# Patient Record
Sex: Female | Born: 1990 | Race: Black or African American | Hispanic: No | Marital: Married | Smoking: Never smoker
Health system: Southern US, Community
[De-identification: ages and names within clinical notes are randomized; demographics above are authoritative.]

## PROBLEM LIST (undated history)

## (undated) DIAGNOSIS — E559 Vitamin D deficiency, unspecified: Secondary | ICD-10-CM

## (undated) DIAGNOSIS — E785 Hyperlipidemia, unspecified: Secondary | ICD-10-CM

## (undated) DIAGNOSIS — E049 Nontoxic goiter, unspecified: Secondary | ICD-10-CM

## (undated) DIAGNOSIS — Q2730 Arteriovenous malformation, site unspecified: Secondary | ICD-10-CM

## (undated) DIAGNOSIS — E039 Hypothyroidism, unspecified: Secondary | ICD-10-CM

## (undated) DIAGNOSIS — R5383 Other fatigue: Secondary | ICD-10-CM

## (undated) DIAGNOSIS — N809 Endometriosis, unspecified: Secondary | ICD-10-CM

## (undated) HISTORY — DX: Endometriosis, unspecified: N80.9

## (undated) HISTORY — DX: Hypothyroidism, unspecified: E03.9

## (undated) HISTORY — DX: Other fatigue: R53.83

## (undated) HISTORY — DX: Hyperlipidemia, unspecified: E78.5

## (undated) HISTORY — PX: ENDOMETRIAL ABLATION: SHX621

## (undated) HISTORY — DX: Vitamin D deficiency, unspecified: E55.9

## (undated) HISTORY — DX: Nontoxic goiter, unspecified: E04.9

## (undated) HISTORY — PX: WISDOM TOOTH EXTRACTION: SHX21

## (undated) HISTORY — PX: TONSILLECTOMY: SUR1361

---
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
## 2012-05-19 ENCOUNTER — Ambulatory Visit: Payer: Self-pay | Admitting: Internal Medicine

## 2012-05-24 ENCOUNTER — Emergency Department (HOSPITAL_COMMUNITY)
Admission: EM | Admit: 2012-05-24 | Discharge: 2012-05-25 | Disposition: A | Discharge status: Home / Self Care | Payer: Managed Care, Other (non HMO) | Attending: Emergency Medicine | Admitting: Emergency Medicine

## 2012-05-24 ENCOUNTER — Encounter (HOSPITAL_COMMUNITY): Payer: Self-pay | Admitting: *Deleted

## 2012-05-24 DIAGNOSIS — Z880 Allergy status to penicillin: Secondary | ICD-10-CM | POA: Insufficient documentation

## 2012-05-24 DIAGNOSIS — Z882 Allergy status to sulfonamides status: Secondary | ICD-10-CM | POA: Insufficient documentation

## 2012-05-24 DIAGNOSIS — M79669 Pain in unspecified lower leg: Secondary | ICD-10-CM

## 2012-05-24 DIAGNOSIS — M79609 Pain in unspecified limb: Secondary | ICD-10-CM | POA: Insufficient documentation

## 2012-05-24 NOTE — ED Notes (Signed)
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

## 2012-05-24 NOTE — ED Notes (Signed)
Pt st's she first started having pain in right lower leg and had a neg doppler study.  Now having pain in bil lower legs with no swelling.  Pulses present and strong bil.

## 2012-05-24 NOTE — ED Notes (Signed)
Pt to ED c/o bil leg pain.  Pt was seen by pcp 4 days ago for R lower leg pain and swelling.  [place] was negative for dvt per pt and pt was told she may have a pinched nerve.  Today her L leg began hurting as well, pain the same as R leg, starts low and shoots up, causing numbness.  Pt was told by pcp to stop BC and she did.

## 2012-05-25 ENCOUNTER — Ambulatory Visit (HOSPITAL_COMMUNITY)
Admission: RE | Admit: 2012-05-25 | Discharge: 2012-05-25 | Disposition: A | Discharge status: Home / Self Care | Payer: Managed Care, Other (non HMO) | Source: Ambulatory Visit | Attending: Pediatrics | Admitting: Pediatrics

## 2012-05-25 DIAGNOSIS — M7989 Other specified soft tissue disorders: Secondary | ICD-10-CM | POA: Insufficient documentation

## 2012-05-25 DIAGNOSIS — M79609 Pain in unspecified limb: Secondary | ICD-10-CM

## 2012-05-25 LAB — CBC
HCT: 35.5 % — ABNORMAL LOW (ref 36.0–46.0)
Hemoglobin: 12.2 g/dL (ref 12.0–15.0)
MCH: 29.8 pg (ref 26.0–34.0)
MCHC: 34.4 g/dL (ref 30.0–36.0)
RBC: 4.09 MIL/uL (ref 3.87–5.11)

## 2012-05-25 LAB — BASIC METABOLIC PANEL
BUN: 12 mg/dL (ref 6–23)
CO2: 28 mEq/L (ref 19–32)
GFR calc non Af Amer: >90 mL/min (ref >90)
Glucose, Bld: 89 mg/dL (ref 70–99)
Potassium: 3.6 mEq/L (ref 3.5–5.1)

## 2012-05-25 MED ORDER — ENOXAPARIN SODIUM 80 MG/0.8ML ~~LOC~~ SOLN
65.0000 mg | Freq: Once | SUBCUTANEOUS | Status: AC
  Administered 2012-05-25: 65 mg via SUBCUTANEOUS
  Filled 2012-05-25: qty 0.8

## 2012-05-25 NOTE — ED Provider Notes (Signed)
History     CSN: [id number]  Arrival date & time 05/24/12  2319   First MD Initiated Contact with Patient 05/24/12 2337      Chief Complaint  Patient presents with  . Leg Pain    bil    (Consider location/radiation/quality/duration/timing/severity/associated sxs/prior treatment) HPI Comments: No female presents the emergency department with her mom complaining of worsening bilateral leg pain. She states her right calf began hurting a little less than 2 weeks ago. She states the pain is constant and worse with walking. She tried taking ibuprofen and Tylenol Extra Strength without relief. She went to see her primary care Dr. 4 days ago who told her to stop her birth control and obtained a lower tremor the ultrasound. The ultrasound was negative for DVT. Since then she went on a car trip to [place], and since then her left calf began hurting along with worsening right calf pain. Pain radiates around her leg up to her anterior thighs. Admits to occasional numbness in her calf. Denies back pain, fever, chills, nausea or vomiting. No significant medical history.  Patient is a 21 y.o. female presenting with leg pain. The history is provided by the patient and a parent.  Leg Pain  Associated symptoms include numbness.    History reviewed. No pertinent past medical history.  History reviewed. No pertinent past surgical history.  No family history on file.  History  Substance Use Topics  . Smoking status: Not on file  . Smokeless tobacco: Not on file  . Alcohol Use: Not on file    OB History    Grav Para Term Preterm Abortions TAB SAB Ect Mult Living                  Review of Systems  Constitutional: Negative for fever and chills.  Cardiovascular: Negative for leg swelling.  Gastrointestinal: Negative for nausea and vomiting.  Musculoskeletal: Positive for myalgias and gait problem. Negative for back pain.  Skin: Negative for color change.  Neurological: Positive for numbness.     Allergies  Penicillins cross reactors; Amoxicillin; and Sulfa drugs cross reactors  Home Medications   Current Outpatient Rx  Name Route Sig Dispense Refill  . ACETAMINOPHEN 500 MG PO TABS Oral Take 500 mg by mouth every 6 (six) hours as needed. For pain    . IBUPROFEN 200 MG PO TABS Oral Take 400 mg by mouth every 6 (six) hours as needed. For pain    . ADULT MULTIVITAMIN W/MINERALS CH Oral Take 1 tablet by mouth daily.    . NORETHINDRONE ACET-ETHINYL EST 1-20 MG-MCG PO TABS Oral Take 1 tablet by mouth daily.      BP 126/80  Pulse 74  Temp 97.7 F (36.5 C) (Oral)  Resp 18  SpO2 100%  Physical Exam  Constitutional: She is oriented to person, place, and time. She appears well-developed and well-nourished. No distress.  HENT:  Head: Normocephalic and atraumatic.  Eyes: Conjunctivae normal and EOM are normal.  Neck: Normal range of motion. Neck supple.  Cardiovascular: Normal rate, regular rhythm, normal heart sounds and intact distal pulses.        No extremity edema.  Pulmonary/Chest: Effort normal and breath sounds normal.  Musculoskeletal:       Right knee: Normal.       Left knee: Normal.       Lumbar back: Normal.       Calf tenderness present bilaterally. Tenderness to the anterior aspect of her quads. Positive Homans  bilateral. Full range of motion of lower extremities. Calves are equal in size and do not appear edematous.  Neurological: She is alert and oriented to person, place, and time. She has normal strength. No sensory deficit.  Skin: Skin is warm and dry. No erythema.  Psychiatric: She has a normal mood and affect. Her behavior is normal.    ED Course  Procedures (including critical care time)  Labs Reviewed - No data to display No results found.   1. Calf pain       MDM  21 year old female with bilateral calf pain. Lower extremity ultrasound with PCP negative for DVT last week. Increased pain after traveling to [place] this past weekend. No calf  swelling. Calves are equal in size. Positive Homans bilateral. Positive calf tenderness, right greater than left. Denies any history of hematologic disorder, or bleeding or clotting disorders. Mom denies any family history of the same. She has no shortness of breath, tachypnea, chest pain, tachycardia or palpitations concerning for pulmonary embolism. Lovenox given as prophylaxis. Patient will return first thing in the morning for bilateral lower chimney duplex ultrasound due to ultrasound being closed for the night. Case discussed with Dr. Norlene Campbell who agrees with plan of care.       Trevor Mace, PA-C 05/25/12 0110

## 2012-05-25 NOTE — ED Notes (Signed)
Family at bedside. Plan of care explained to pt.  Pt voices understanding.  Waiting for Lovenox to come from pharmacy.

## 2012-05-25 NOTE — ED Notes (Signed)
Pt dc to home.  Will f/u as needed. Ambulatory to exit without difficulty.

## 2012-05-25 NOTE — ED Provider Notes (Signed)
Medical screening examination/treatment/procedure(s) were performed by non-physician practitioner and as supervising physician I was immediately available for consultation/collaboration.  Amelia Burgard M Cincere Deprey, MD 05/25/12 0516 

## 2012-05-25 NOTE — Progress Notes (Signed)
Bilateral:  No evidence of DVT, superficial thrombosis, or Baker's Cyst.   

## 2014-02-23 ENCOUNTER — Emergency Department (HOSPITAL_COMMUNITY): Payer: Managed Care, Other (non HMO)

## 2014-02-23 ENCOUNTER — Encounter (HOSPITAL_COMMUNITY): Payer: Self-pay | Admitting: Emergency Medicine

## 2014-02-23 ENCOUNTER — Emergency Department (HOSPITAL_COMMUNITY)
Admission: EM | Admit: 2014-02-23 | Discharge: 2014-02-23 | Disposition: A | Discharge status: Home / Self Care | Payer: Managed Care, Other (non HMO) | Attending: Emergency Medicine | Admitting: Emergency Medicine

## 2014-02-23 DIAGNOSIS — R110 Nausea: Secondary | ICD-10-CM | POA: Insufficient documentation

## 2014-02-23 DIAGNOSIS — R1084 Generalized abdominal pain: Secondary | ICD-10-CM | POA: Insufficient documentation

## 2014-02-23 DIAGNOSIS — Z79899 Other long term (current) drug therapy: Secondary | ICD-10-CM | POA: Insufficient documentation

## 2014-02-23 DIAGNOSIS — Z3202 Encounter for pregnancy test, result negative: Secondary | ICD-10-CM | POA: Insufficient documentation

## 2014-02-23 DIAGNOSIS — K921 Melena: Secondary | ICD-10-CM | POA: Insufficient documentation

## 2014-02-23 DIAGNOSIS — R109 Unspecified abdominal pain: Secondary | ICD-10-CM

## 2014-02-23 DIAGNOSIS — Z880 Allergy status to penicillin: Secondary | ICD-10-CM | POA: Insufficient documentation

## 2014-02-23 DIAGNOSIS — N939 Abnormal uterine and vaginal bleeding, unspecified: Secondary | ICD-10-CM

## 2014-02-23 DIAGNOSIS — N898 Other specified noninflammatory disorders of vagina: Secondary | ICD-10-CM | POA: Insufficient documentation

## 2014-02-23 LAB — COMPREHENSIVE METABOLIC PANEL
ALBUMIN: 4.3 g/dL (ref 3.5–5.2)
ALK PHOS: 42 U/L (ref 39–117)
ALT: 9 U/L (ref 0–35)
AST: 16 U/L (ref 0–37)
Anion gap: 12 (ref 5–15)
BUN: 10 mg/dL (ref 6–23)
CO2: 26 mEq/L (ref 19–32)
Calcium: 9.4 mg/dL (ref 8.4–10.5)
Chloride: 101 mEq/L (ref 96–112)
Creatinine, Ser: 0.78 mg/dL (ref 0.50–1.10)
GFR calc Af Amer: >90 mL/min (ref >90)
GFR calc non Af Amer: >90 mL/min (ref >90)
Glucose, Bld: 87 mg/dL (ref 70–99)
POTASSIUM: 4.2 meq/L (ref 3.7–5.3)
SODIUM: 139 meq/L (ref 137–147)
TOTAL PROTEIN: 7.9 g/dL (ref 6.0–8.3)
Total Bilirubin: 1.5 mg/dL — ABNORMAL HIGH (ref 0.3–1.2)

## 2014-02-23 LAB — WET PREP, GENITAL
Clue Cells Wet Prep HPF POC: NONE SEEN
Trich, Wet Prep: NONE SEEN
Yeast Wet Prep HPF POC: NONE SEEN

## 2014-02-23 LAB — CBC WITH DIFFERENTIAL/PLATELET
BASOS PCT: 1 % (ref 0–1)
Basophils Absolute: 0 10*3/uL (ref 0.0–0.1)
EOS ABS: 0.2 10*3/uL (ref 0.0–0.7)
Eosinophils Relative: 5 % (ref 0–5)
HCT: 37.5 % (ref 36.0–46.0)
Hemoglobin: 12.6 g/dL (ref 12.0–15.0)
Lymphocytes Relative: 48 % — ABNORMAL HIGH (ref 12–46)
Lymphs Abs: 1.9 10*3/uL (ref 0.7–4.0)
MCH: 29.5 pg (ref 26.0–34.0)
MCHC: 33.6 g/dL (ref 30.0–36.0)
MCV: 87.8 fL (ref 78.0–100.0)
Monocytes Absolute: 0.4 10*3/uL (ref 0.1–1.0)
Monocytes Relative: 10 % (ref 3–12)
NEUTROS PCT: 36 % — AB (ref 43–77)
Neutro Abs: 1.4 10*3/uL — ABNORMAL LOW (ref 1.7–7.7)
PLATELETS: 284 10*3/uL (ref 150–400)
RBC: 4.27 MIL/uL (ref 3.87–5.11)
RDW: 12.8 % (ref 11.5–15.5)
WBC: 3.9 10*3/uL — ABNORMAL LOW (ref 4.0–10.5)

## 2014-02-23 LAB — URINALYSIS, ROUTINE W REFLEX MICROSCOPIC
Bilirubin Urine: NEGATIVE
GLUCOSE, UA: NEGATIVE mg/dL
Ketones, ur: NEGATIVE mg/dL
Leukocytes, UA: NEGATIVE
Nitrite: NEGATIVE
PH: 7.5 (ref 5.0–8.0)
Protein, ur: NEGATIVE mg/dL
SPECIFIC GRAVITY, URINE: 1.023 (ref 1.005–1.030)
UROBILINOGEN UA: 1 mg/dL (ref 0.0–1.0)

## 2014-02-23 LAB — URINE MICROSCOPIC-ADD ON

## 2014-02-23 LAB — POC URINE PREG, ED: Preg Test, Ur: NEGATIVE

## 2014-02-23 MED ORDER — IOHEXOL 300 MG/ML  SOLN
25.0000 mL | Freq: Once | INTRAMUSCULAR | Status: AC | PRN
  Administered 2014-02-23: 25 mL via ORAL

## 2014-02-23 MED ORDER — IOHEXOL 300 MG/ML  SOLN
100.0000 mL | Freq: Once | INTRAMUSCULAR | Status: AC | PRN
  Administered 2014-02-23: 100 mL via INTRAVENOUS

## 2014-02-23 NOTE — ED Provider Notes (Signed)
CSN: [id number]     Arrival date & time 02/23/14  1623 History   First MD Initiated Contact with Patient 02/23/14 1653     Chief Complaint  Patient presents with  . Abdominal Pain     (Consider location/radiation/quality/duration/timing/severity/associated sxs/prior Treatment) The history is provided by the patient.   patient's had pain in her abdomen for the last 2 days. Crampy. She's had some black stool. She also states that her periods have been abnormal. She states both that it was 2 weeks late and that came 4 weeks after the last one. No other bleeding. She states she has had cramping. No dysuria. The pain is dull.  No past medical history on file. No past surgical history on file. No family history on file. History  Substance Use Topics  . Smoking status: Never Smoker   . Smokeless tobacco: Not on file  . Alcohol Use: No   OB History   Grav Para Term Preterm Abortions TAB SAB Ect Mult Living                 Review of Systems  Constitutional: Negative for activity change and appetite change.  Eyes: Negative for pain.  Respiratory: Negative for chest tightness and shortness of breath.   Cardiovascular: Negative for chest pain and leg swelling.  Gastrointestinal: Positive for nausea, abdominal pain and blood in stool. Negative for vomiting and diarrhea.  Genitourinary: Positive for vaginal bleeding. Negative for flank pain, decreased urine volume, vaginal pain and pelvic pain.  Musculoskeletal: Negative for back pain and neck stiffness.  Skin: Negative for rash.  Neurological: Negative for weakness, numbness and headaches.  Psychiatric/Behavioral: Negative for behavioral problems.      Allergies  Penicillins cross reactors; Amoxicillin; and Sulfa drugs cross reactors  Home Medications   Prior to Admission medications   Medication Sig Start Date End Date Taking? Authorizing Provider  acetaminophen (TYLENOL) 500 MG tablet Take 500 mg by mouth every 6 (six) hours as  needed. For pain    Historical Provider, MD  ibuprofen (ADVIL,MOTRIN) 200 MG tablet Take 400 mg by mouth every 6 (six) hours as needed. For pain    Historical Provider, MD  Multiple Vitamin (MULTIVITAMIN WITH MINERALS) TABS Take 1 tablet by mouth daily.    Historical Provider, MD  norethindrone-ethinyl estradiol (MICROGESTIN,JUNEL,LOESTRIN) 1-20 MG-MCG tablet Take 1 tablet by mouth daily.    Historical Provider, MD   BP 118/75  Pulse 64  Temp(Src) 97.7 F (36.5 C) (Oral)  Resp 18  Ht 5\' 11"  (1.803 m)  Wt 144 lb (65.318 kg)  BMI 20.09 kg/m2  SpO2 100% Physical Exam  Nursing note and vitals reviewed. Constitutional: She is oriented to person, place, and time. She appears well-developed and well-nourished.  HENT:  Head: Normocephalic and atraumatic.  Eyes: EOM are normal. Pupils are equal, round, and reactive to light.  Neck: Normal range of motion. Neck supple.  Cardiovascular: Normal rate, regular rhythm and normal heart sounds.   No murmur heard. Pulmonary/Chest: Effort normal and breath sounds normal. No respiratory distress. She has no wheezes. She has no rales.  Abdominal: Soft. Bowel sounds are normal. She exhibits no distension. There is tenderness. There is no rebound and no guarding.  Mild diffuse abdominal tenderness. No rebound guarding. No masses.  Genitourinary:  Vaginal bleeding. No cervical motion tenderness. Rectal exam done with normal color stool, however there was some blood, which may come from vagina.  Musculoskeletal: Normal range of motion.  Neurological: She is alert and  oriented to person, place, and time. No cranial nerve deficit.  Skin: Skin is warm and dry.  Psychiatric: She has a normal mood and affect. Her speech is normal.    ED Course  Procedures (including critical care time) Labs Review Labs Reviewed  CBC WITH DIFFERENTIAL - Abnormal; Notable for the following:    WBC 3.9 (*)    Neutrophils Relative % 36 (*)    Neutro Abs 1.4 (*)     Lymphocytes Relative 48 (*)    All other components within normal limits  COMPREHENSIVE METABOLIC PANEL  URINALYSIS, ROUTINE W REFLEX MICROSCOPIC  POC URINE PREG, ED  POC OCCULT BLOOD, ED    Imaging Review No results found.   EKG Interpretation None      MDM   Final diagnoses:  None    Patient with abdominal pain. Some vaginal bleeding. Laboratory reassuring. Pelvic exam nonspecific vaginal bleeding. CT scan done in reassuring. Patient has followup with gynecology already. Will discharge home    Juliet Rude. Rubin Payor, MD 02/23/14 2142

## 2014-02-23 NOTE — Discharge Instructions (Signed)

## 2014-02-23 NOTE — ED Notes (Signed)
Pt completed CT contrast/notified CT staff

## 2014-02-23 NOTE — ED Notes (Signed)
She states "ive had kidney and stomach pain, black stool, and i felt like i was going to start my period a while back but it ended up being 2 weeks late."

## 2014-02-24 ENCOUNTER — Inpatient Hospital Stay (HOSPITAL_COMMUNITY)
Admission: AD | Admit: 2014-02-24 | Discharge: 2014-02-24 | Disposition: A | Discharge status: Home / Self Care | Payer: Managed Care, Other (non HMO) | Source: Ambulatory Visit | Attending: Obstetrics & Gynecology | Admitting: Obstetrics & Gynecology

## 2014-02-24 ENCOUNTER — Encounter (HOSPITAL_COMMUNITY): Payer: Self-pay | Admitting: *Deleted

## 2014-02-24 ENCOUNTER — Inpatient Hospital Stay (HOSPITAL_COMMUNITY): Payer: Managed Care, Other (non HMO)

## 2014-02-24 DIAGNOSIS — N946 Dysmenorrhea, unspecified: Secondary | ICD-10-CM | POA: Insufficient documentation

## 2014-02-24 DIAGNOSIS — R109 Unspecified abdominal pain: Secondary | ICD-10-CM | POA: Insufficient documentation

## 2014-02-24 DIAGNOSIS — N83209 Unspecified ovarian cyst, unspecified side: Secondary | ICD-10-CM

## 2014-02-24 LAB — GC/CHLAMYDIA PROBE AMP
CT Probe RNA: NEGATIVE
GC Probe RNA: NEGATIVE

## 2014-02-24 LAB — URINALYSIS, ROUTINE W REFLEX MICROSCOPIC
Bilirubin Urine: NEGATIVE
Glucose, UA: NEGATIVE mg/dL
KETONES UR: NEGATIVE mg/dL
NITRITE: NEGATIVE
PH: 7.5 (ref 5.0–8.0)
PROTEIN: NEGATIVE mg/dL
Specific Gravity, Urine: 1.01 (ref 1.005–1.030)
Urobilinogen, UA: 1 mg/dL (ref 0.0–1.0)

## 2014-02-24 LAB — POCT PREGNANCY, URINE: Preg Test, Ur: NEGATIVE

## 2014-02-24 LAB — URINE MICROSCOPIC-ADD ON

## 2014-02-24 LAB — HIV ANTIBODY (ROUTINE TESTING W REFLEX): HIV: NONREACTIVE

## 2014-02-24 MED ORDER — NAPROXEN SODIUM 550 MG PO TABS: 550.0000 mg | ORAL_TABLET | Freq: Two times a day (BID) | ORAL | Status: DC

## 2014-02-24 MED ORDER — KETOROLAC TROMETHAMINE 60 MG/2ML IM SOLN
60.0000 mg | Freq: Once | INTRAMUSCULAR | Status: AC
  Administered 2014-02-24: 60 mg via INTRAMUSCULAR
  Filled 2014-02-24: qty 2

## 2014-02-24 NOTE — MAU Provider Note (Signed)
History     CSN: [id number]  Arrival date and time: 02/24/14 1884   First Provider Initiated Contact with Patient 02/24/14 2014      Chief Complaint  Patient presents with  . Abdominal Pain   HPI Pt is not pregnant and presents with lower abd pain for 2 days. Menarche was about age 23 with RCM with Minimal dysmenorrhea until recently when her periods became somewhat irregular.  Pt had onset of bilateral lower abdominal pain as well as bilateral mid abd pain- pain is associated with decreased appetite/nausea but no vomiting.  Pt is not sexually active. Pt /s mother says there is a family history of endometriosis, fibroids and ovarian cysts.  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  Pt says she has taken a Naproxyn today at about   Claudie Revering, RN Registered Nurse Signed  MAU Note Service date: 02/24/2014 7:45 PM   Lower abdominal cramping x 1 week. Positive for vaginal bleeding; states currently on period but was told last night by ED physician that it looked like more than a normal period. Went to [facility] last night with same complaint & discharged, pain has gotten worse since then. Black stools since last week. Denies fever. Has f/u with Dr. Stefano Gaul but has never been seen at [facility].        History reviewed. No pertinent past medical history.  Past Surgical History  Procedure Laterality Date  . Wisdom tooth extraction    . Tonsillectomy      Family History  Problem Relation Age of Onset  . Diabetes Maternal Grandmother   . Hypertension Maternal Grandmother   . Diabetes Maternal Grandfather   . Hypertension Maternal Grandfather     History  Substance Use Topics  . Smoking status: Never Smoker   . Smokeless tobacco: Not on file  . Alcohol Use: No    Allergies:  Allergies  Allergen Reactions  . Amoxicillin Rash  . Penicillins Cross Reactors Rash  . Sulfa Drugs Cross Reactors Rash     Prescriptions prior to admission  Medication Sig Dispense Refill  . ibuprofen (ADVIL,MOTRIN) 200 MG tablet Take 400 mg by mouth every 6 (six) hours as needed for headache or moderate pain. For pain        Review of Systems  Constitutional: Negative for fever and chills.  Gastrointestinal: Positive for nausea, abdominal pain and diarrhea. Negative for constipation.  Genitourinary: Negative for dysuria and urgency.   Physical Exam   Blood pressure 121/76, pulse 65, temperature 97.7 F (36.5 C), temperature source Oral, resp. rate 16, height 5\' 11"  (1.803 m), weight 140 lb 12.8 oz (63.866 kg), last menstrual period 02/22/2014, SpO2 100.00%.  Physical Exam  Nursing note and vitals reviewed. Constitutional: She is oriented to person, place, and time. She appears well-developed and well-nourished. No distress.  HENT:  Head: Normocephalic.  Eyes: Pupils are equal, round, and reactive to light.  Neck: Normal range of motion. Neck supple.  Cardiovascular: Normal rate.   Respiratory: Effort normal.  GI: Soft. She exhibits no distension. There is no tenderness. There is no rebound and no guarding.  Genitourinary: Vagina normal and uterus normal.  Mildly tender right adnexa  Musculoskeletal: Normal range of motion.  Neurological: She is alert and oriented to person, place, and time.  Skin: Skin is warm and dry.  Psychiatric: She has a normal mood and affect.    MAU Course  Procedures  Results for orders placed during the hospital encounter of 02/24/14 (from the past 24 hour(s))  URINALYSIS, ROUTINE W REFLEX MICROSCOPIC     Status: Abnormal   Collection Time    02/24/14  7:49 PM      Result Value Ref Range   Color, Urine YELLOW  YELLOW   APPearance CLEAR  CLEAR   Specific Gravity, Urine 1.010  1.005 - 1.030   pH 7.5  5.0 - 8.0   Glucose, UA NEGATIVE  NEGATIVE mg/dL   Hgb urine dipstick LARGE (*) NEGATIVE   Bilirubin Urine NEGATIVE  NEGATIVE   Ketones, ur NEGATIVE  NEGATIVE  mg/dL   Protein, ur NEGATIVE  NEGATIVE mg/dL   Urobilinogen, UA 1.0  0.0 - 1.0 mg/dL   Nitrite NEGATIVE  NEGATIVE   Leukocytes, UA TRACE (*) NEGATIVE  URINE MICROSCOPIC-ADD ON     Status: Abnormal   Collection Time    02/24/14  7:49 PM      Result Value Ref Range   Squamous Epithelial / LPF FEW (*) RARE   WBC, UA 3-6  <3 WBC/hpf   RBC / HPF 7-10  <3 RBC/hpf   Bacteria, UA RARE  RARE  POCT PREGNANCY, URINE     Status: None   Collection Time    02/24/14  7:54 PM      Result Value Ref Range   Preg Test, Ur NEGATIVE  NEGATIVE    1d ago  47yr ago      WBC 4.0 - 10.5 K/uL 3.9 (L)    6.9        RBC 3.87 - 5.11 MIL/uL 4.27    4.09        Hemoglobin 12.0 - 15.0 g/dL 16.1    09.6        HCT 36.0 - 46.0 % 37.5    35.5 (L)        MCV 78.0 - 100.0 fL 87.8    86.8        MCH 26.0 - 34.0 pg 29.5    29.8        MCHC 30.0 - 36.0 g/dL 04.5    40.9        RDW 11.5 - 15.5 % 12.8    12.1        Platelets 150 - 400 K/uL 284    272        Neutrophils Relative % 43 - 77 % 36 (L)          Neutro Abs 1.7 - 7.7 K/uL 1.4 (L)          Lymphocytes Relative 12 - 46 % 48 (H)          Lymphs Abs 0.7 - 4.0 K/uL 1.9          Monocytes Relative 3 - 12 % 10          Monocytes Absolute 0.1 - 1.0 K/uL 0.4          Eosinophils Relative 0 - 5 % 5          Eosinophils Absolute 0.0 - 0.7 K/uL 0.2          Basophils Relative 0 - 1 % 1          Basophils Absolute 0.0 - 0.1 K/uL 0.0         Resulting Agency   [facility]    Specimen Collected: 02/23/14  5:00 PM Last Resulted: 02/23/14  5:24 PM  Ct Abdomen Pelvis W Contrast  02/23/2014   CLINICAL DATA:  Bilateral lower abdominal pain  EXAM: CT ABDOMEN AND PELVIS WITH CONTRAST  TECHNIQUE: Multidetector CT imaging of the abdomen and pelvis was performed using the standard protocol following bolus administration of intravenous contrast.  CONTRAST:  100mL OMNIPAQUE IOHEXOL 300 MG/ML  SOLN  COMPARISON:  None.  FINDINGS: The lung bases are  clear.  The liver demonstrates no focal abnormality. There is no intrahepatic or extrahepatic biliary ductal dilatation. The gallbladder is normal. The spleen demonstrates no focal abnormality. The kidneys, adrenal glands and pancreas are normal. The bladder is unremarkable.  The stomach, duodenum, small intestine, and large intestine demonstrate no contrast extravasation or dilatation. There is a normal caliber appendix in the right lower quadrant without periappendiceal inflammatory changes. There is no pneumoperitoneum, pneumatosis, or portal venous gas. There is no abdominal or pelvic free fluid. There is no lymphadenopathy. The uterus and ovaries are normal.  The abdominal aorta is normal in caliber.  There are no lytic or sclerotic osseous lesions.  IMPRESSION: 1. No acute abdominal or pelvic pathology.   Electronically Signed   By: Elige KoHetal  Patel   On: 02/23/2014 21:26   [place] Transvaginal Non-ob  02/24/2014   CLINICAL DATA:  Pelvic pain  EXAM: TRANSABDOMINAL AND TRANSVAGINAL ULTRASOUND OF PELVIS  TECHNIQUE: Both transabdominal and transvaginal ultrasound examinations of the pelvis were performed. Transabdominal technique was performed for global imaging of the pelvis including uterus, ovaries, adnexal regions, and pelvic cul-de-sac. It was necessary to proceed with endovaginal exam following the transabdominal exam to visualize the endometrium and ovaries.  COMPARISON:  None  FINDINGS: Uterus  Measurements: 8.2 x 3.2 x 5.6 cm. 5 mm anechoic mass in the uterine body likely representing a small cyst. No fibroids or other mass visualized.  Endometrium  Thickness: 4.3 mm.  No focal abnormality visualized.  Right ovary  Measurements: 4 x 2.4 x 2.5 cm. Small hypoechoic 1.3 x 1.2 x 1.3 cm right ovarian mass without internal Doppler flow which may represent a small hemorrhagic cyst versus endometrioma.  Left ovary  Measurements: 3.5 x 2.4 x 2.9 cm. Normal appearance/no adnexal mass.  Other findings  Trace amount of  pelvic free fluid.  IMPRESSION: 1. Small hypoechoic 1.3 x 1.2 x 1.3 cm right ovarian mass without internal Doppler flow which may represent a small hemorrhagic cyst versus endometrioma.   Electronically Signed   By: Elige KoHetal  Patel   On: 02/24/2014 22:46   [place] Pelvis Complete  02/24/2014   CLINICAL DATA:  Pelvic pain  EXAM: TRANSABDOMINAL AND TRANSVAGINAL ULTRASOUND OF PELVIS  TECHNIQUE: Both transabdominal and transvaginal ultrasound examinations of the pelvis were performed. Transabdominal technique was performed for global imaging of the pelvis including uterus, ovaries, adnexal regions, and pelvic cul-de-sac. It was necessary to proceed with endovaginal exam following the transabdominal exam to visualize the endometrium and ovaries.  COMPARISON:  None  FINDINGS: Uterus  Measurements: 8.2 x 3.2 x 5.6 cm. 5 mm anechoic mass in the uterine body likely representing a small cyst. No fibroids or other mass visualized.  Endometrium  Thickness: 4.3 mm.  No focal abnormality visualized.  Right ovary  Measurements: 4 x 2.4 x 2.5 cm. Small hypoechoic 1.3 x 1.2 x 1.3 cm right ovarian mass without internal Doppler flow which may represent a small hemorrhagic cyst versus endometrioma.  Left ovary  Measurements: 3.5 x 2.4 x 2.9 cm. Normal appearance/no adnexal mass.  Other findings  Trace amount of pelvic free fluid.  IMPRESSION: 1. Small hypoechoic 1.3 x  1.2 x 1.3 cm right ovarian mass without internal Doppler flow which may represent a small hemorrhagic cyst versus endometrioma.   Electronically Signed   By: Elige Ko   On: 02/24/2014 22:46   Ct Abdomen Pelvis W Contrast  02/23/2014   CLINICAL DATA:  Bilateral lower abdominal pain  EXAM: CT ABDOMEN AND PELVIS WITH CONTRAST  TECHNIQUE: Multidetector CT imaging of the abdomen and pelvis was performed using the standard protocol following bolus administration of intravenous contrast.  CONTRAST:  OMNIPAQUE IOHEXOL 300 MG/ML  SOLN  COMPARISON:  None.  FINDINGS: The  lung bases are clear.  The liver demonstrates no focal abnormality. There is no intrahepatic or extrahepatic biliary ductal dilatation. The gallbladder is normal. The spleen demonstrates no focal abnormality. The kidneys, adrenal glands and pancreas are normal. The bladder is unremarkable.  The stomach, duodenum, small intestine, and large intestine demonstrate no contrast extravasation or dilatation. There is a normal caliber appendix in the right lower quadrant without periappendiceal inflammatory changes. There is no pneumoperitoneum, pneumatosis, or portal venous gas. There is no abdominal or pelvic free fluid. There is no lymphadenopathy. The uterus and ovaries are normal.  The abdominal aorta is normal in caliber.  There are no lytic or sclerotic osseous lesions.  IMPRESSION: 1. No acute abdominal or pelvic pathology.   Electronically Signed   By: Elige Ko   On: 02/23/2014 21:26   Toradol 60mg  IM given to pt with improvement in pain Discussed with patient and mother ultrasound findings Assessment and Plan  Right ovarian cyst Ananprox DS BID for pain F/u with Dr. Stefano Gaul as scheduled   [facility] 02/24/2014, 8:16 PM .

## 2014-02-24 NOTE — MAU Note (Signed)
Lower abdominal cramping x 1 week. Positive for vaginal bleeding; states currently on period but was told last night by ED physician that it looked like more than a normal period. Went to [facility] last night with same complaint & discharged, pain has gotten worse since then. Black stools since last week. Denies fever. Has f/u with Dr. Stefano GaulStringer but has never been seen at [facility].

## 2014-02-24 NOTE — Discharge Instructions (Signed)
Ovarian Cyst °An ovarian cyst is a sac filled with fluid or blood. This sac is attached to the ovary. Some cysts go away on their own. Other cysts need treatment.  °HOME CARE  °· Only take medicine as told by your doctor. °· Follow up with your doctor as told. °· Get regular pelvic exams and Pap tests. °GET HELP IF: °· Your periods are late, not regular, or painful. °· You stop having periods. °· Your belly (abdominal) or pelvic pain does not go away. °· Your belly becomes large or puffy (swollen). °· You have a hard time peeing (totally emptying your bladder). °· You have pressure on your bladder. °· You have pain during sex. °· You feel fullness, pressure, or discomfort in your belly. °· You lose weight for no reason. °· You feel sick most of the time. °· You have a hard time pooping (constipation). °· You do not feel like eating. °· You develop pimples (acne). °· You have an increase in hair on your body and face. °· You are gaining weight for no reason. °· You think you are pregnant. °GET HELP RIGHT AWAY IF:  °· Your belly pain gets worse. °· You feel sick to your stomach (nauseous), and you throw up (vomit). °· You have a fever that comes on fast. °· You have belly pain while pooping (bowel movement). °· Your periods are heavier than usual. °MAKE SURE YOU:  °· Understand these instructions. °· Will watch your condition. °· Will get help right away if you are not doing well or get worse. °Document Released: 01/13/2008 Document Revised: 05/17/2013 Document Reviewed: 04/03/2013 °Exit[employer]® Patient Information ©2015 [employer]. This information is not intended to replace advice given to you by your health care provider. Make sure you discuss any questions you have with your health care provider. ° °

## 2014-02-25 NOTE — MAU Provider Note (Signed)
Attestation of Attending Supervision of Advanced Practitioner (CNM/NP): Evaluation and management procedures were performed by the Advanced Practitioner under my supervision and collaboration.  I have reviewed the Advanced Practitioner's note and chart, and I agree with the management and plan.  HARRAWAY-SMITH, Cambree Hendrix 5:10 AM

## 2015-08-11 DIAGNOSIS — K6289 Other specified diseases of anus and rectum: Secondary | ICD-10-CM | POA: Insufficient documentation

## 2016-04-10 ENCOUNTER — Telehealth: Payer: Self-pay | Admitting: Gastroenterology

## 2016-04-10 NOTE — Telephone Encounter (Signed)
Patient called to cancel her appointment with Dr. Servando SnareWohl due to a meeting. When I went to reschedule I was going to give her an earlier day but she can only do mornings. I gave her the following Wednesday morning but she wants a 8:00. I explained to her that Dr. Annabell SabalWohl's nurse was on vacation and I would have to ok that and call her back. She stated she was going to find somewhere else.

## 2016-04-15 ENCOUNTER — Ambulatory Visit: Payer: Self-pay | Admitting: Gastroenterology

## 2016-05-20 ENCOUNTER — Ambulatory Visit: Payer: Self-pay | Admitting: Gastroenterology

## 2016-12-11 ENCOUNTER — Encounter: Payer: Self-pay | Admitting: Surgery

## 2016-12-11 ENCOUNTER — Other Ambulatory Visit: Payer: Self-pay

## 2016-12-11 ENCOUNTER — Other Ambulatory Visit
Admission: RE | Admit: 2016-12-11 | Discharge: 2016-12-11 | Disposition: A | Discharge status: Home / Self Care | Payer: Managed Care, Other (non HMO) | Source: Ambulatory Visit | Attending: Surgery | Admitting: Surgery

## 2016-12-11 ENCOUNTER — Ambulatory Visit (INDEPENDENT_AMBULATORY_CARE_PROVIDER_SITE_OTHER): Payer: Managed Care, Other (non HMO) | Admitting: Surgery

## 2016-12-11 ENCOUNTER — Telehealth: Payer: Self-pay

## 2016-12-11 VITALS — BP 137/87 | HR 81 | Temp 97.5°F | Ht 71.0 in | Wt 165.6 lb

## 2016-12-11 DIAGNOSIS — E049 Nontoxic goiter, unspecified: Secondary | ICD-10-CM | POA: Diagnosis not present

## 2016-12-11 DIAGNOSIS — L02416 Cutaneous abscess of left lower limb: Secondary | ICD-10-CM | POA: Diagnosis not present

## 2016-12-11 DIAGNOSIS — L02419 Cutaneous abscess of limb, unspecified: Secondary | ICD-10-CM

## 2016-12-11 DIAGNOSIS — E785 Hyperlipidemia, unspecified: Secondary | ICD-10-CM | POA: Diagnosis not present

## 2016-12-11 DIAGNOSIS — L03116 Cellulitis of left lower limb: Secondary | ICD-10-CM

## 2016-12-11 DIAGNOSIS — E039 Hypothyroidism, unspecified: Secondary | ICD-10-CM | POA: Diagnosis not present

## 2016-12-11 DIAGNOSIS — E559 Vitamin D deficiency, unspecified: Secondary | ICD-10-CM | POA: Insufficient documentation

## 2016-12-11 DIAGNOSIS — L03119 Cellulitis of unspecified part of limb: Principal | ICD-10-CM

## 2016-12-11 NOTE — Telephone Encounter (Signed)
Culture carried to [facility] lab 12/11/16.

## 2016-12-11 NOTE — Patient Instructions (Addendum)
Please pack the wound daily. Please apply a large band-aid over the area after you pack the wound.  Please see your follow up appointment listed below.

## 2016-12-11 NOTE — Progress Notes (Signed)
Patient ID: Marilyn Cline, female   DOB: [birth year], 26 y.o.   MRN: [id number]  HPI Marilyn Cline is a 26 y.o. female seen in consultation at the request of Dr. Dario Guardian. He reports about 3 days ago left leg pain and attributes this to a spider bite. She went to her primary care doctor and was given doxycycline with improvement of symptoms. She reports that the pain was sharp and constant and moderate in intensity. Now the pain is mild to moderate and intermittent. The pain is worsening with movement and when she applies pressure to the left leg. No fevers no chills. No nausea no vomiting. She is able to take by mouth. She has great Montez Morita vascular performance able to perform more than 6 Mets of activity without any shortness of breath or chest pain. The notes and records from her referring MD were reviewed  HPI  Past Medical History:  Diagnosis Date  . Endometriosis   . Fatigue   . Goiter   . Hyperlipidemia   . Hypothyroidism   . Vitamin D deficiency     Past Surgical History:  Procedure Laterality Date  . ENDOMETRIAL ABLATION    . TONSILLECTOMY    . WISDOM TOOTH EXTRACTION      Family History  Problem Relation Age of Onset  . Diabetes Maternal Grandmother   . Hypertension Maternal Grandmother   . Diabetes Maternal Grandfather   . Hypertension Maternal Grandfather   . Hypertension Mother   . Hypertension Father     Social History Social History  Substance Use Topics  . Smoking status: Current Every Day Smoker  . Smokeless tobacco: Never Used  . Alcohol use No    Allergies  Allergen Reactions  . Amoxicillin Rash  . Penicillins Cross Reactors Rash  . Sulfa Drugs Cross Reactors Rash    Current Outpatient Prescriptions  Medication Sig Dispense Refill  . DASETTA 1/35 tablet     . doxycycline (VIBRAMYCIN) 100 MG capsule     . ibuprofen (ADVIL,MOTRIN) 200 MG tablet Take 400 mg by mouth every 6 (six) hours as needed for headache or moderate pain.    Marland Kitchen JOLESSA  0.15-0.03 MG tablet     . naproxen (NAPROSYN) 375 MG tablet Take 375 mg by mouth daily as needed for moderate pain.    . naproxen sodium (ANAPROX DS) 550 MG tablet Take 1 tablet (550 mg total) by mouth 2 (two) times daily with a meal. 20 tablet 0   No current facility-administered medications for this visit.      Review of Systems Full ROS  was asked and was negative except for the information on the HPI  Physical Exam Blood pressure 137/87, pulse 81, temperature 97.5 F (36.4 C), temperature source Oral, height 5\' 11"  (1.803 m), weight 75.1 kg (165 lb 9.6 oz). CONSTITUTIONAL: NAD, non toxic EYES: Sclera are non-icteric, conjunctiva non injected EARS, NOSE, MOUTH AND THROAT: T The oral mucosa is pink and moist. Hearing is intact to voice. LYMPH NODES:  Lymph nodes in the neck are normal. RESPIRATORY: There is normal respiratory effort,  without pathologic use of accessory muscles. CARDIOVASCULAR:GI: The abdomen is  soft, nontender, and nondistended. There are no palpable masses. No peritonitis  GU: Rectal deferred.   MUSCULOSKELETAL: Normal muscle strength and tone. No cyanosis or edema.   SKIN: There is blanching erythema and area of fluctuance on left leg, no evidence of necrotizing infection NEUROLOGIC: Motor and sensation is grossly normal. Cranial nerves are grossly intact. PSYCH:  Oriented to person, place and time. Affect is normal.  Data Reviewed  I have personally reviewed the patient's imaging, laboratory findings and medical records.    Assessment/Plan Left leg abscess in need for I&D. Discussed with the patient in detail about the procedure and about the risk, benefits and possible complications including but not limited to: Bleeding, infection, recurrence. She understands and a consent was signed.  Procedure NOTE  1. I/D simple abscess left leg 2. Excisional debridement of skin and subq tissue 2cm2  Anesthesia: lidocaine 1% w epi  EBL: minimal   Complications:  none  Patient was prepped and draped in the usual sterile fashion. Local anesthetic injected and using an 11 blade knife we incised the cavity. Purulence material obtained and sent for culture. Using the knife we excised some necrotic fat and skin in the standard fashion. Hemostasis was obtained with pressure. Quarter-inch packing was placed. Wound was dressed. No complications   Sterling Bigiego Pabon, MD FACS General Surgeon 12/11/2016, 9:44 AM

## 2016-12-14 ENCOUNTER — Telehealth: Payer: Self-pay

## 2016-12-14 LAB — AEROBIC CULTURE  (SUPERFICIAL SPECIMEN)

## 2016-12-14 LAB — AEROBIC CULTURE W GRAM STAIN (SUPERFICIAL SPECIMEN)

## 2016-12-14 MED ORDER — CLINDAMYCIN HCL 300 MG PO CAPS: 300.0000 mg | ORAL_CAPSULE | Freq: Two times a day (BID) | ORAL | 0 refills | Status: DC

## 2016-12-14 MED ORDER — CLINDAMYCIN HCL 300 MG PO CAPS: 300.0000 mg | ORAL_CAPSULE | Freq: Three times a day (TID) | ORAL | 0 refills | Status: AC

## 2016-12-14 NOTE — Telephone Encounter (Signed)
Spoke with patient and discussed Culture results.  Per Dr.Piscoya Clindamycin 300 mg every hours x 7 days.  Patient instructed to pick up medication at pharmacy and reminded of follow appointment. Patient verbalized understanding.

## 2016-12-23 ENCOUNTER — Ambulatory Visit (INDEPENDENT_AMBULATORY_CARE_PROVIDER_SITE_OTHER): Payer: Managed Care, Other (non HMO) | Admitting: Surgery

## 2016-12-23 ENCOUNTER — Encounter: Payer: Self-pay | Admitting: Surgery

## 2016-12-23 VITALS — BP 116/76 | HR 88 | Temp 98.2°F | Ht 71.0 in | Wt 164.0 lb

## 2016-12-23 DIAGNOSIS — Z09 Encounter for follow-up examination after completed treatment for conditions other than malignant neoplasm: Secondary | ICD-10-CM | POA: Diagnosis not present

## 2016-12-23 DIAGNOSIS — N946 Dysmenorrhea, unspecified: Secondary | ICD-10-CM | POA: Insufficient documentation

## 2016-12-23 DIAGNOSIS — R102 Pelvic and perineal pain: Secondary | ICD-10-CM | POA: Insufficient documentation

## 2016-12-23 DIAGNOSIS — N939 Abnormal uterine and vaginal bleeding, unspecified: Secondary | ICD-10-CM | POA: Insufficient documentation

## 2016-12-23 DIAGNOSIS — N926 Irregular menstruation, unspecified: Secondary | ICD-10-CM | POA: Insufficient documentation

## 2016-12-23 NOTE — Progress Notes (Signed)
S/p I/d Left leg. MRSA. Completed A/Bs Doing well No pain, no fever or chills Taking Po  PE NAD Wound healing well, no erythema, small eschar. No necrotizing infection  A/P Doing well May place daily Band-Aid No surgical issues

## 2016-12-23 NOTE — Patient Instructions (Signed)
We will not need to see you back in office.  Continue to change your dressing as needed.  Call our office if you have any questions or concerns.

## 2016-12-24 ENCOUNTER — Encounter: Payer: Managed Care, Other (non HMO) | Admitting: Surgery

## 2017-12-22 ENCOUNTER — Emergency Department
Admission: EM | Admit: 2017-12-22 | Discharge: 2017-12-23 | Disposition: A | Discharge status: Home / Self Care | Payer: No Typology Code available for payment source | Attending: Emergency Medicine | Admitting: Emergency Medicine

## 2017-12-22 ENCOUNTER — Other Ambulatory Visit: Payer: Self-pay

## 2017-12-22 DIAGNOSIS — T310 Burns involving less than 10% of body surface: Secondary | ICD-10-CM | POA: Insufficient documentation

## 2017-12-22 DIAGNOSIS — F17210 Nicotine dependence, cigarettes, uncomplicated: Secondary | ICD-10-CM | POA: Diagnosis not present

## 2017-12-22 DIAGNOSIS — Z79899 Other long term (current) drug therapy: Secondary | ICD-10-CM | POA: Insufficient documentation

## 2017-12-22 DIAGNOSIS — Y92511 Restaurant or cafe as the place of occurrence of the external cause: Secondary | ICD-10-CM | POA: Insufficient documentation

## 2017-12-22 DIAGNOSIS — E039 Hypothyroidism, unspecified: Secondary | ICD-10-CM | POA: Insufficient documentation

## 2017-12-22 DIAGNOSIS — Y9389 Activity, other specified: Secondary | ICD-10-CM | POA: Insufficient documentation

## 2017-12-22 DIAGNOSIS — Y990 Civilian activity done for income or pay: Secondary | ICD-10-CM | POA: Insufficient documentation

## 2017-12-22 DIAGNOSIS — T25222A Burn of second degree of left foot, initial encounter: Secondary | ICD-10-CM | POA: Insufficient documentation

## 2017-12-22 DIAGNOSIS — X118XXA Contact with other hot tap-water, initial encounter: Secondary | ICD-10-CM | POA: Insufficient documentation

## 2017-12-22 DIAGNOSIS — S99822A Other specified injuries of left foot, initial encounter: Secondary | ICD-10-CM | POA: Diagnosis present

## 2017-12-22 MED ORDER — HYDROCODONE-ACETAMINOPHEN 5-325 MG PO TABS
1.0000 | ORAL_TABLET | Freq: Once | ORAL | Status: AC
  Administered 2017-12-22: 1 via ORAL
  Filled 2017-12-22: qty 1

## 2017-12-22 MED ORDER — BACITRACIN ZINC 500 UNIT/GM EX OINT
TOPICAL_OINTMENT | Freq: Once | CUTANEOUS | Status: AC
  Administered 2017-12-23: 1 via TOPICAL
  Filled 2017-12-22: qty 0.9

## 2017-12-22 MED ORDER — ONDANSETRON 4 MG PO TBDP
4.0000 mg | ORAL_TABLET | Freq: Once | ORAL | Status: AC
  Administered 2017-12-22: 4 mg via ORAL
  Filled 2017-12-22: qty 1
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
## 2017-12-23 MED ORDER — HYDROCODONE-ACETAMINOPHEN 5-325 MG PO TABS: 1.0000 | ORAL_TABLET | Freq: Four times a day (QID) | ORAL | 0 refills | Status: AC | PRN
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
## 2017-12-23 NOTE — Discharge Instructions (Addendum)
Keep the wound clean and covered. Use OTC bacitracin ointment to dress the wound. Take the pain medicine as needed. Consider follow-up with the wound care center or your provider.

## 2018-02-02 ENCOUNTER — Encounter: Payer: No Typology Code available for payment source | Attending: Internal Medicine | Admitting: Internal Medicine

## 2018-02-02 DIAGNOSIS — T25222D Burn of second degree of left foot, subsequent encounter: Secondary | ICD-10-CM | POA: Diagnosis not present

## 2018-02-02 DIAGNOSIS — X118XXD Contact with other hot tap-water, subsequent encounter: Secondary | ICD-10-CM | POA: Diagnosis not present

## 2018-02-02 DIAGNOSIS — Z880 Allergy status to penicillin: Secondary | ICD-10-CM | POA: Diagnosis not present

## 2018-02-02 DIAGNOSIS — Z882 Allergy status to sulfonamides status: Secondary | ICD-10-CM | POA: Insufficient documentation

## 2018-02-03 NOTE — Progress Notes (Signed)
Marilyn Cline, Marilyn Cline. ([id number]) Visit Report for 02/02/2018 Abuse/Suicide Risk Screen Details Patient Name: Marilyn Cline, Marilyn Cline. Date of Service: 02/02/2018 9:45 AM Medical Record Number: [id number] Patient Account Number: [id number] Date of Birth/Sex: October 04, 1990 (26 y.o. F) Treating RN: Phillis HaggisPinkerton, Debi Primary Care Ewell Benassi: Sherrie MustacheJADALI, FAYEGH Other Clinician: Referring Marleny Faller: Sherrie MustacheJADALI, FAYEGH Treating Kymari Lollis/Extender: Altamese CarolinaOBSON, MICHAEL G Weeks in Treatment: 0 Abuse/Suicide Risk Screen Items Answer ABUSE/SUICIDE RISK SCREEN: Has anyone close to you tried to hurt or harm you recentlyo No Do you feel uncomfortable with anyone in your familyo No Has anyone forced you do things that you didnot want to doo No Do you have any thoughts of harming yourselfo No Patient displays signs or symptoms of abuse and/or neglect. No Electronic Signature(s) Signed: 02/02/2018 4:02:05 PM By: Alejandro MullingPinkerton, Debra Entered By: Alejandro MullingPinkerton, Debra on 02/02/2018 09:59:14 Marilyn Cline, Marilyn Cline. ([id number]) -------------------------------------------------------------------------------- Activities of Daily Living Details Patient Name: Marilyn Cline, Marilyn Cline. Date of Service: 02/02/2018 9:45 AM Medical Record Number: [id number] Patient Account Number: [id number] Date of Birth/Sex: October 04, 1990 (26 y.o. F) Treating RN: Phillis HaggisPinkerton, Debi Primary Care Garey Alleva: Sherrie MustacheJADALI, FAYEGH Other Clinician: Referring Cederick Broadnax: Sherrie MustacheJADALI, FAYEGH Treating Taylon Louison/Extender: Altamese CarolinaOBSON, MICHAEL G Weeks in Treatment: 0 Activities of Daily Living Items Answer Activities of Daily Living (Please select one for each item) Drive Automobile Completely Able Take Medications Completely Able Use Telephone Completely Able Care for Appearance Completely Able Use Toilet Completely Able Bath / Shower Completely Able Dress Self Completely Able Feed Self Completely Able Walk Completely Able Get In / Out Bed Completely Able Housework Completely Able Prepare Meals  Completely Able Handle Money Completely Able Shop for Self Completely Able Electronic Signature(s) Signed: 02/02/2018 4:02:05 PM By: Alejandro MullingPinkerton, Debra Entered By: Alejandro MullingPinkerton, Debra on 02/02/2018 09:59:31 Marilyn Cline, Marilyn Cline. ([id number]) -------------------------------------------------------------------------------- Education Assessment Details Patient Name: Marilyn Cline, Marilyn Cline. Date of Service: 02/02/2018 9:45 AM Medical Record Number: [id number] Patient Account Number: [id number] Date of Birth/Sex: October 04, 1990 (26 y.o. F) Treating RN: Phillis HaggisPinkerton, Debi Primary Care Sharene Krikorian: Sherrie MustacheJADALI, FAYEGH Other Clinician: Referring Ad Guttman: Sherrie MustacheJADALI, FAYEGH Treating Delita Chiquito/Extender: Altamese CarolinaOBSON, MICHAEL G Weeks in Treatment: 0 Primary Learner Assessed: Patient Learning Preferences/Education Level/Primary Language Learning Preference: Explanation, Printed Material Highest Education Level: College or Above Preferred Language: English Cognitive Barrier Assessment/Beliefs Language Barrier: No Translator Needed: No Memory Deficit: No Emotional Barrier: No Cultural/Religious Beliefs Affecting Medical Care: No Physical Barrier Assessment Impaired Vision: Yes Glasses Impaired Hearing: No Decreased Hand dexterity: No Knowledge/Comprehension Assessment Knowledge Level: Medium Comprehension Level: Medium Ability to understand written Medium instructions: Ability to understand verbal Medium instructions: Motivation Assessment Anxiety Level: Calm Cooperation: Cooperative Education Importance: Acknowledges Need Interest in Health Problems: Asks Questions Perception: Coherent Willingness to Engage in Self- Medium Management Activities: Readiness to Engage in Self- Medium Management Activities: Electronic Signature(s) Signed: 02/02/2018 4:02:05 PM By: Alejandro MullingPinkerton, Debra Entered By: Alejandro MullingPinkerton, Debra on 02/02/2018 09:59:51 Marilyn Cline, Marilyn Cline.  ([id number]) -------------------------------------------------------------------------------- Fall Risk Assessment Details Patient Name: Marilyn Cline, Marilyn Cline. Date of Service: 02/02/2018 9:45 AM Medical Record Number: [id number] Patient Account Number: [id number] Date of Birth/Sex: October 04, 1990 (26 y.o. F) Treating RN: Phillis HaggisPinkerton, Debi Primary Care Lynnae Ludemann: Sherrie MustacheJADALI, FAYEGH Other Clinician: Referring Marynell Bies: Sherrie MustacheJADALI, FAYEGH Treating Lianni Kanaan/Extender: Altamese CarolinaOBSON, MICHAEL G Weeks in Treatment: 0 Fall Risk Assessment Items Have you had 2 or more falls in the last 12 monthso 0 No Have you had any fall that resulted in injury in the last 12 monthso 0 No FALL RISK ASSESSMENT: History of falling - immediate or within 3 months 0 No Secondary diagnosis 0 No Ambulatory aid None/bed rest/wheelchair/nurse 0 No Crutches/cane/walker 0  No Furniture 0 No IV Access/Saline Lock 0 No Gait/Training Normal/bed rest/immobile 0 No Weak 0 No Impaired 0 No Mental Status Oriented to own ability 0 Yes Electronic Signature(s) Signed: 02/02/2018 4:02:05 PM By: Alejandro Mulling Entered By: Alejandro Mulling on 02/02/2018 09:59:58 STEWART, Adonis Brook ([id number]) -------------------------------------------------------------------------------- Foot Assessment Details Patient Name: Marilyn Gladden. Date of Service: 02/02/2018 9:45 AM Medical Record Number: [id number] Patient Account Number: [id number] Date of Birth/Sex: September 19, 1990 (26 y.o. F) Treating RN: Phillis Haggis Primary Care Lilu Mcglown: Sherrie Mustache Other Clinician: Referring Desarea Ohagan: Sherrie Mustache Treating Humbert Morozov/Extender: Altamese Braxton in Treatment: 0 Foot Assessment Items Site Locations + = Sensation present, - = Sensation absent, Cline = Callus, U = Ulcer R = Redness, W = Warmth, M = Maceration, PU = Pre-ulcerative lesion F = Fissure, S = Swelling, D = Dryness Assessment Right: Left: Other Deformity: No No Prior Foot Ulcer: No No Prior  Amputation: No No Charcot Joint: No No Ambulatory Status: Ambulatory Without Help Gait: Steady Electronic Signature(s) Signed: 02/02/2018 4:02:05 PM By: Alejandro Mulling Entered By: Alejandro Mulling on 02/02/2018 10:02:08 Marilyn Gladden ([id number]) -------------------------------------------------------------------------------- Nutrition Risk Assessment Details Patient Name: Marilyn Gladden. Date of Service: 02/02/2018 9:45 AM Medical Record Number: [id number] Patient Account Number: [id number] Date of Birth/Sex: 11-Jan-1991 (26 y.o. F) Treating RN: Phillis Haggis Primary Care Josephus Harriger: Sherrie Mustache Other Clinician: Referring Mauro Arps: Sherrie Mustache Treating Besan Ketchem/Extender: Altamese Hamilton in Treatment: 0 Height (in): 70 Weight (lbs): 162 Body Mass Index (BMI): 23.2 Nutrition Risk Assessment Items NUTRITION RISK SCREEN: I have an illness or condition that made me change the kind and/or amount of 0 No food I eat I eat fewer than two meals per day 3 Yes I eat few fruits and vegetables, or milk products 0 No I have three or more drinks of beer, liquor or wine almost every day 0 No I have tooth or mouth problems that make it hard for me to eat 0 No I don't always have enough money to buy the food I need 0 No I eat alone most of the time 0 No I take three or more different prescribed or over-the-counter drugs a day 0 No Without wanting to, I have lost or gained 10 pounds in the last six months 0 No I am not always physically able to shop, cook and/or feed myself 0 No Nutrition Protocols Good Risk Protocol Moderate Risk Protocol Electronic Signature(s) Signed: 02/02/2018 4:02:05 PM By: Alejandro Mulling Entered By: Alejandro Mulling on 02/02/2018 10:00:32

## 2018-02-04 NOTE — Progress Notes (Signed)
HEELA, HEISHMAN ([id number]) Visit Report for 02/02/2018 Chief Complaint Document Details Patient Name: Marilyn Cline, Marilyn Cline. Date of Service: 02/02/2018 9:45 AM Medical Record Number: [id number] Patient Account Number: [id number] Date of Birth/Sex: Feb 15, 1991 (26 y.o. F) Treating RN: Huel Coventry Primary Care Provider: Sherrie Mustache Other Clinician: Referring Provider: Sherrie Mustache Treating Provider/Extender: Altamese [place] in Treatment: 0 Information Obtained from: Patient Chief Complaint 02/02/18; patient is here to review a burn injury on the left dorsal foot Electronic Signature(s) Signed: 02/02/2018 6:35:58 PM By: Baltazar Najjar MD Entered By: Baltazar Najjar on 02/02/2018 12:37:28 Marilyn Cline ([id number]) -------------------------------------------------------------------------------- HPI Details Patient Name: Marilyn Cline Date of Service: 02/02/2018 9:45 AM Medical Record Number: [id number] Patient Account Number: [id number] Date of Birth/Sex: Jan 31, 1991 (26 y.o. F) Treating RN: Huel Coventry Primary Care Provider: Sherrie Mustache Other Clinician: Referring Provider: Sherrie Mustache Treating Provider/Extender: Altamese [place] in Treatment: 0 History of Present Illness HPI Description: 02/03/15; this is an otherwise healthy 27 year old woman who burned herself at work [[employer]] 1 hot water spilled on her dorsal foot. She was seen in the ED on 12/22/17 diagnosed with a second-degree burn. She had blistering. She is allergic to sulfa therefore was prescribed bacitracin as well as a nonstick dressing. She did not have an x-ray no cultures were done. Since that time she has been to see her primary doctor and then recently noticed some drainage coming out of the wound and was seen at another urgent care although that information is not available to me in Epic. She called her caseworker [Work[employer]] and she was referred here. She still complains of  some discomfort in the area especially laterally. She's noted some blistering. She is not using any footwear that would be rubbing on the area. She is not a diabetic ABIs in our clinic on the left was 1.28 Electronic Signature(s) Signed: 02/02/2018 6:35:58 PM By: Baltazar Najjar MD Entered By: Baltazar Najjar on 02/02/2018 12:41:55 Marilyn Cline ([id number]) -------------------------------------------------------------------------------- Physical Exam Details Patient Name: UBAH, RADKE. Date of Service: 02/02/2018 9:45 AM Medical Record Number: [id number] Patient Account Number: [id number] Date of Birth/Sex: 1991-07-30 (26 y.o. F) Treating RN: Huel Coventry Primary Care Provider: Sherrie Mustache Other Clinician: Referring Provider: Sherrie Mustache Treating Provider/Extender: Alt[place] in Treatment: 0 Constitutional Sitting or standing Blood Pressure is within target range for patient.. Pulse regular and within target range for patient.Marland Kitchen Respirations regular, non-labored and within target range.. Temperature is normal and within the target range for the patient.Marland Kitchen appears in no distress.she does not appear to be systemically unwell or uncomfortable. Eyes Conjunctivae clear. No discharge. Respiratory Respiratory effort is easy and symmetric bilaterally. Rate is normal at rest and on room air.. Cardiovascular Pedal pulses palpable and strong bilaterally.. Integumentary (Hair, Skin) primary skin issues are seen. Psychiatric No evidence of depression, anxiety, or agitation. Calm, cooperative, and communicative. Appropriate interactions and affect.. Notes when exam; the area questions on the left dorsal foot at the level of her metatarsals. The area is healed from her burn point of view. She does appear to have some irritation around the wound including some small blisters. There is no palpable tenderness no overt cellulitis is seen and certainly no drainage. The area was  carefully inspected Electronic Signature(s) Signed: 02/02/2018 6:35:58 PM By: Baltazar Najjar MD Entered By: Baltazar Najjar on 02/02/2018 12:43:50 Marilyn Cline ([id number]) -------------------------------------------------------------------------------- Physician Orders Details Patient Name: Marilyn Cline Date of Service: 02/02/2018 9:45 AM Medical Record Number: [id number] Patient Account  Number: [id number] Date of Birth/Sex: 1991/05/29 (26 y.o. F) Treating RN: Huel CoventryWoody, Kim Primary Care Provider: Sherrie MustacheJADALI, FAYEGH Other Clinician: Referring Provider: Sherrie MustacheJADALI, FAYEGH Treating Provider/Extender: Altamese CarolinaOBSON, MICHAEL G [place] in Treatment: 0 Verbal / Phone Orders: No Diagnosis Coding Medications-please add to medication list. Wound #1 Left,Dorsal Foot o Other: - Discontinue use of Bacitracin; Use hydrocortisone cream if needed. [facility] Wound #1 Left,Dorsal Foot o Discharge from Wound Care Center Electronic Signature(s) Signed: 02/02/2018 5:44:56 PM By: Elliot GurneyWoody, BSN, RN, CWS, Kim RN, BSN Signed: 02/02/2018 6:35:58 PM By: Baltazar Najjarobson, Michael MD Entered By: Elliot GurneyWoody, BSN, RN, CWS, Kim on 02/02/2018 10:46:08 Marilyn GladdenSTEWART, Quamesha C. ([id number]) -------------------------------------------------------------------------------- Problem List Details Patient Name: Marilyn GladdenSTEWART, Tephanie C. Date of Service: 02/02/2018 9:45 AM Medical Record Number: [id number] Patient Account Number: [id number] Date of Birth/Sex: 1991/05/29 (26 y.o. F) Treating RN: Huel CoventryWoody, Kim Primary Care Provider: Sherrie MustacheJADALI, FAYEGH Other Clinician: Referring Provider: Sherrie MustacheJADALI, FAYEGH Treating Provider/Extender: Altamese CarolinaOBSON, MICHAEL G [place] in Treatment: 0 Active Problems ICD-10 Evaluated Encounter Code Description Active Date Today Diagnosis T25.222D Burn of second degree of left foot, subsequent encounter 02/02/2018 Yes Yes Status Complications Interventions Other N/a there is fully epithelialized as far as I can tell  although recently he had had Medical some drainage Decision lending her to go to [place] : an urgent care. I see no open area here. There appears to be some skin issues around this Inactive Problems Resolved Problems Electronic Signature(s) Signed: 02/02/2018 6:35:58 PM By: Baltazar Najjarobson, Michael MD Entered By: Baltazar Najjarobson, Michael on 02/02/2018 12:36:21 Marilyn GladdenSTEWART, Monserat C. ([id number]) -------------------------------------------------------------------------------- Progress Note Details Patient Name: Marilyn GladdenSTEWART, Rayanne C. Date of Service: 02/02/2018 9:45 AM Medical Record Number: [id number] Patient Account Number: [id number] Date of Birth/Sex: 1991/05/29 (26 y.o. F) Treating RN: Huel CoventryWoody, Kim Primary Care Provider: Sherrie MustacheJADALI, FAYEGH Other Clinician: Referring Provider: Sherrie MustacheJADALI, FAYEGH Treating Provider/Extender: Altamese CarolinaOBSON, MICHAEL G [place] in Treatment: 0 Subjective Chief Complaint Information obtained from Patient 02/02/18; patient is here to review a burn injury on the left dorsal foot History of Present Illness (HPI) 02/03/15; this is an otherwise healthy 27 year old woman who burned herself at work [[employer]] 1 hot water spilled on her dorsal foot. She was seen in the ED on 12/22/17 diagnosed with a second-degree burn. She had blistering. She is allergic to sulfa therefore was prescribed bacitracin as well as a nonstick dressing. She did not have an x-ray no cultures were done. Since that time she has been to see her primary doctor and then recently noticed some drainage coming out of the wound and was seen at another urgent care although that information is not available to me in Epic. She called her caseworker [Work[employer]'s Compensation] and she was referred here. She still complains of some discomfort in the area especially laterally. She's noted some blistering. She is not using any footwear that would be rubbing on the area. She is not a diabetic ABIs in our clinic on the left was 1.28 Wound  History Patient presents with 1 open wound that has been present for approximately 12/22/17. Patient has been treating wound in the following manner: bactrim ointment. Laboratory tests have not been performed in the last month. Patient reportedly has not tested positive for an antibiotic resistant organism. Patient reportedly has not tested positive for osteomyelitis. Patient reportedly has not had testing performed to evaluate circulation in the legs. Patient History Information obtained from Patient. Allergies sulfur, amoxicillin, penicillin Family History Diabetes - Maternal Grandparents, Hypertension - Father,Paternal Grandparents,Maternal Grandparents, Stroke - Paternal Grandparents, No family history of Cancer, Heart Disease,  Hereditary Spherocytosis, Kidney Disease, Lung Disease, Seizures, Thyroid Problems, Tuberculosis. Social History Never smoker, Marital Status - Single, Alcohol Use - Never, Drug Use - No History, Caffeine Use - Never. Medical History Endocrine Denies history of Type I Diabetes, Type II Diabetes Review of Systems (ROS) Constitutional Symptoms (General Health) Complains or has symptoms of Fatigue. Eyes KYRSTAN, GOTWALT. ([id number]) Complains or has symptoms of Glasses / Contacts. Ear/Nose/Mouth/Throat The patient has no complaints or symptoms. Hematologic/Lymphatic vitamin D defiency Respiratory The patient has no complaints or symptoms. Cardiovascular hyperlipidemia Gastrointestinal The patient has no complaints or symptoms. Endocrine Complains or has symptoms of Thyroid disease. Denies complaints or symptoms of Hepatitis, Polydypsia (Excessive Thirst). Genitourinary endometriosis Immunological The patient has no complaints or symptoms. Integumentary (Skin) The patient has no complaints or symptoms. Musculoskeletal The patient has no complaints or symptoms. Neurologic The patient has no complaints or symptoms. Oncologic The patient has no  complaints or symptoms. Psychiatric The patient has no complaints or symptoms. Objective Constitutional Sitting or standing Blood Pressure is within target range for patient.. Pulse regular and within target range for patient.Marland Kitchen Respirations regular, non-labored and within target range.. Temperature is normal and within the target range for the patient.Marland Kitchen appears in no distress.she does not appear to be systemically unwell or uncomfortable. Vitals Time Taken: 9:51 AM, Height: 70 in, Source: Stated, Weight: 162 lbs, Source: Measured, BMI: 23.2, Temperature: 98.0 F, Pulse: 67 bpm, Respiratory Rate: 18 breaths/min, Blood Pressure: 118/59 mmHg. Eyes Conjunctivae clear. No discharge. Respiratory Respiratory effort is easy and symmetric bilaterally. Rate is normal at rest and on room air.. Cardiovascular Pedal pulses palpable and strong bilaterally.Marland Kitchen Psychiatric No evidence of depression, anxiety, or agitation. Calm, cooperative, and communicative. Appropriate interactions and affect.Roseanne Reno, Adonis Brook ([id number]) General Notes: when exam; the area questions on the left dorsal foot at the level of her metatarsals. The area is healed from her burn point of view. She does appear to have some irritation around the wound including some small blisters. There is no palpable tenderness no overt cellulitis is seen and certainly no drainage. The area was carefully inspected Integumentary (Hair, Skin) primary skin issues are seen. Assessment Active Problems ICD-10 Burn of second degree of left foot, subsequent encounter Plan Medications-please add to medication list.: Wound #1 Left,Dorsal Foot: Other: - Discontinue use of Bacitracin; Use hydrocortisone cream if needed. [facility]charge [facility] Services: Wound #1 Left,Dorsal Foot: Discharge from Wound Care Center Medical Decision Making Burn of second degree of left foot, subsequent encounter 02/02/2018 Status: Other Complications:  N/a Interventions: there is fully epithelialized as far as I can tell although recently he had had some drainage lending her to go to an urgent care. I see no open area here. There appears to be some skin issues around this #1 from a burn/healing point of view I think this area is healed #2 she may have some secondary dermatitis perhaps related to the bacitracin or the other at gauze she is putting on the wound. I recommended some hydrocortisone cream to see if that's the case. I've also recommended stopping the bacitracin and simply covering the area with gauze in her footwear. #3 I don't think she needs to be followed here. I don't think any systemic antibiotics are currently necessary Electronic Signature(s) Signed: 02/02/2018 6:35:58 PM By: Baltazar Najjar MD Entered By: Baltazar Najjar on 02/02/2018 12:44:42 BRIANY, AYE ([id number]) MALETA, PACHA ([id number]) -------------------------------------------------------------------------------- ROS/PFSH Details Patient Name: Marilyn Cline Date of Service: 02/02/2018 9:45 AM Medical Record Number:  [id number] Patient Account Number: [id number] Date of Birth/Sex: 1990-08-31 (26 y.o. F) Treating RN: Phillis Haggis Primary Care Provider: Sherrie Mustache Other Clinician: Referring Provider: Sherrie Mustache Treating Provider/Extender: Altamese [place] in Treatment: 0 Information Obtained From Patient Wound History Do you currently have one or more open woundso Yes How many open wounds do you currently haveo 1 Approximately how long have you had your woundso 12/22/17 How have you been treating your wound(s) until nowo bactrim ointment Has your wound(s) ever healed and then re-openedo No Have you had any lab work done in the past montho No Have you tested positive for an antibiotic resistant organism (MRSA, VRE)o No Have you tested positive for osteomyelitis (bone infection)o No Have you had any tests for circulation on your  legso No Constitutional Symptoms (General Health) Complaints and Symptoms: Positive for: Fatigue Eyes Complaints and Symptoms: Positive for: Glasses / Contacts Endocrine Complaints and Symptoms: Positive for: Thyroid disease Negative for: Hepatitis; Polydypsia (Excessive Thirst) Medical History: Negative for: Type I Diabetes; Type II Diabetes Ear/Nose/Mouth/Throat Complaints and Symptoms: No Complaints or Symptoms Hematologic/Lymphatic Complaints and Symptoms: Review of System Notes: vitamin D defiency Respiratory Complaints and Symptoms: No Complaints or Symptoms Cardiovascular DAWNETTA, COPENHAVER ([id number]) Complaints and Symptoms: Review of System Notes: hyperlipidemia Gastrointestinal Complaints and Symptoms: No Complaints or Symptoms Genitourinary Complaints and Symptoms: Review of System Notes: endometriosis Immunological Complaints and Symptoms: No Complaints or Symptoms Integumentary (Skin) Complaints and Symptoms: No Complaints or Symptoms Musculoskeletal Complaints and Symptoms: No Complaints or Symptoms Neurologic Complaints and Symptoms: No Complaints or Symptoms Oncologic Complaints and Symptoms: No Complaints or Symptoms Psychiatric Complaints and Symptoms: No Complaints or Symptoms Immunizations Pneumococcal Vaccine: Received Pneumococcal Vaccination: No Implantable Devices Family and Social History Cancer: No; Diabetes: Yes - Maternal Grandparents; Heart Disease: No; Hereditary Spherocytosis: No; Hypertension: Yes - Father,Paternal Grandparents,Maternal Grandparents; Kidney Disease: No; Lung Disease: No; Seizures: No; Stroke: Yes - Paternal Grandparents; Thyroid Problems: No; Tuberculosis: No; Never smoker; Marital Status - Single; Alcohol Use: Never; Drug Use: No History; Caffeine Use: Never; Financial Concerns: No; Food, Clothing or Shelter Needs: No; Support System Lacking: No; Transportation Concerns: No; Advanced Directives: No;  Patient does not want information on Advanced Directives; Do not resuscitate: No; Living Will: No; Medical Power of Attorney: No KUMIKO, FISHMAN ([id number]) Electronic Signature(s) Signed: 02/02/2018 4:02:05 PM By: Alejandro Mulling Signed: 02/02/2018 6:35:58 PM By: Baltazar Najjar MD Entered By: Alejandro Mulling on 02/02/2018 10:16:55 Marilyn Cline ([id number]) -------------------------------------------------------------------------------- SuperBill Details Patient Name: Marilyn Cline Date of Service: 02/02/2018 Medical Record Number: [id number] Patient Account Number: [id number] Date of Birth/Sex: November 04, 1990 (26 y.o. F) Treating RN: Huel Coventry Primary Care Provider: Sherrie Mustache Other Clinician: Referring Provider: Sherrie Mustache Treating Provider/Extender: Altamese  in Treatment: 0 Diagnosis Coding ICD-10 Codes Code Description T25.222D Burn of second degree of left foot, subsequent encounter Facility Procedures CPT4 Code: 24401027 Description: 99214 - WOUND CARE VISIT-LEV 4 EST PT Modifier: Quantity: 1 Physician Procedures CPT4 Code: 2536644 Description: 99202 - WC PHYS LEVEL 2 - NEW PT ICD-10 Diagnosis Description T25.222D Burn of second degree of left foot, subsequent encounte Modifier: r Quantity: 1 Electronic Signature(s) Signed: 02/02/2018 6:35:58 PM By: Baltazar Najjar MD Entered By: Baltazar Najjar on 02/02/2018 12:45:16

## 2018-02-04 NOTE — Progress Notes (Signed)
[name], [name] ([id number]) Visit Report for 02/02/2018 Allergy List Details Patient Name: Marilyn Cline, Marilyn Cline. Date of Service: 02/02/2018 9:45 AM Medical Record Number: [id number] Patient Account Number: [id number] Date of Birth/Sex: 1991/01/26 (27 y.o. F) Treating RN: Phillis Haggis Primary Care Kyleena Scheirer: Sherrie Mustache Other Clinician: Referring Zaydah Nawabi: Sherrie Mustache Treating Sabian Kuba/Extender: Maxwell Caul [place] in Treatment: 0 Allergies Active Allergies sulfur amoxicillin penicillin Allergy Notes Electronic Signature(s) Signed: 02/02/2018 4:02:05 PM By: Alejandro Mulling Entered By: Alejandro Mulling on 02/02/2018 09:55:22 Marilyn Cline, Marilyn Cline ([id number]) -------------------------------------------------------------------------------- Arrival Information Details Patient Name: Marilyn Cline Date of Service: 02/02/2018 9:45 AM Medical Record Number: [id number] Patient Account Number: [id number] Date of Birth/Sex: 16-Feb-1991 (27 y.o. F) Treating RN: Phillis Haggis Primary Care Pam Vanalstine: Sherrie Mustache Other Clinician: Referring Mieka Leaton: Sherrie Mustache Treating Brennen Gardiner/Extender: Altamese [place] in Treatment: 0 Visit Information Patient Arrived: Ambulatory Arrival Time: 09:50 Accompanied By: self Transfer Assistance: None Patient Identification Verified: Yes Secondary Verification Process Completed: Yes Patient Requires Transmission-Based No Precautions: Patient Has Alerts: No Electronic Signature(s) Signed: 02/02/2018 4:02:05 PM By: Alejandro Mulling Entered By: Alejandro Mulling on 02/02/2018 09:50:38 Marilyn Cline ([id number]) -------------------------------------------------------------------------------- Clinic Level of Care Assessment Details Patient Name: Marilyn Cline Date of Service: 02/02/2018 9:45 AM Medical Record Number: [id number] Patient Account Number: [id number] Date of Birth/Sex: 01/02/1991 (27 y.o. F) Treating RN: Huel Coventry Primary Care Capri Raben: Sherrie Mustache Other Clinician: Referring Fender Herder: Sherrie Mustache Treating Aliviah Spain/Extender: Altamese [place] in Treatment: 0 Clinic Level of Care Assessment Items TOOL 2 Quantity Score []  - Use when only an EandM is performed on the INITIAL visit 0 ASSESSMENTS - Nursing Assessment / Reassessment X - General Physical Exam (combine w/ comprehensive assessment (listed just below) when 1 20 performed on new pt. evals) X- 1 25 Comprehensive Assessment (HX, ROS, Risk Assessments, Wounds Hx, etc.) ASSESSMENTS - Wound and Skin Assessment / Reassessment X - Simple Wound Assessment / Reassessment - one wound 1 5 []  - 0 Complex Wound Assessment / Reassessment - multiple wounds []  - 0 Dermatologic / Skin Assessment (not related to wound area) ASSESSMENTS - Ostomy and/or Continence Assessment and Care []  - Incontinence Assessment and Management 0 []  - 0 Ostomy Care Assessment and Management (repouching, etc.) PROCESS - Coordination of Care X - Simple Patient / Family Education for ongoing care 1 15 []  - 0 Complex (extensive) Patient / Family Education for ongoing care X- 1 10 Staff obtains Chiropractor, Records, Test Results / Process Orders []  - 0 Staff telephones HHA, Nursing Homes / Clarify orders / etc []  - 0 Routine Transfer to another Facility (non-emergent condition) []  - 0 Routine Hospital Admission (non-emergent condition) []  - 0 New Admissions / Manufacturing engineer / Ordering NPWT, Apligraf, etc. []  - 0 Emergency Hospital Admission (emergent condition) X- 1 10 Simple Discharge Coordination []  - 0 Complex (extensive) Discharge Coordination PROCESS - Special Needs []  - Pediatric / Minor Patient Management 0 []  - 0 Isolation Patient Management Marilyn Cline, Marilyn Cline ([id number]) []  - 0 Hearing / Language / Visual special needs []  - 0 Assessment of Community assistance (transportation, D/C planning, etc.) []  - 0 Additional assistance /  Altered mentation []  - 0 Support Surface(s) Assessment (bed, cushion, seat, etc.) INTERVENTIONS - Wound Cleansing / Measurement X - Wound Imaging (photographs - any number of wounds) 1 5 []  - 0 Wound Tracing (instead of photographs) []  - 0 Simple Wound Measurement - one wound []  - 0 Complex Wound Measurement - multiple wounds X- 1 5 Simple Wound  Cleansing - one wound []  - 0 Complex Wound Cleansing - multiple wounds INTERVENTIONS - Wound Dressings X - Small Wound Dressing one or multiple wounds 1 10 []  - 0 Medium Wound Dressing one or multiple wounds []  - 0 Large Wound Dressing one or multiple wounds []  - 0 Application of Medications - injection INTERVENTIONS - Miscellaneous []  - External ear exam 0 []  - 0 Specimen Collection (cultures, biopsies, blood, body fluids, etc.) []  - 0 Specimen(s) / Culture(s) sent or taken to Lab for analysis []  - 0 Patient Transfer (multiple staff / Nurse, adult / Similar devices) []  - 0 Simple Staple / Suture removal (25 or less) []  - 0 Complex Staple / Suture removal (26 or more) []  - 0 Hypo / Hyperglycemic Management (close monitor of Blood Glucose) X- 1 15 Ankle / Brachial Index (ABI) - do not check if billed separately Has the patient been seen at the hospital within the last three years: Yes Total Score: 120 Level Of Care: New/Established - Level 4 Electronic Signature(s) Signed: 02/02/2018 5:44:56 PM By: Elliot Gurney, BSN, RN, CWS, Kim RN, BSN Entered By: Elliot Gurney, BSN, RN, CWS, Kim on 02/02/2018 11:48:00 Marilyn Cline ([id number]) -------------------------------------------------------------------------------- Encounter Discharge Information Details Patient Name: Marilyn Cline Date of Service: 02/02/2018 9:45 AM Medical Record Number: [id number] Patient Account Number: [id number] Date of Birth/Sex: 08/18/1990 (27 y.o. F) Treating RN: Huel Coventry Primary Care Kendyl Bissonnette: Sherrie Mustache Other Clinician: Referring Yusif Gnau: Sherrie Mustache Treating Xitlalli Newhard/Extender: Altamese [place] in Treatment: 0 Encounter Discharge Information Items Discharge Condition: Stable Ambulatory Status: Ambulatory Discharge Destination: Home Transportation: Private Auto Accompanied By: self Schedule Follow-up Appointment: Yes Clinical Summary of Care: Electronic Signature(s) Signed: 02/02/2018 11:49:18 AM By: Elliot Gurney, BSN, RN, CWS, Kim RN, BSN Entered By: Elliot Gurney, BSN, RN, CWS, Kim on 02/02/2018 11:49:17 Marilyn Cline ([id number]) -------------------------------------------------------------------------------- Lower Extremity Assessment Details Patient Name: Marilyn Cline, VARAS. Date of Service: 02/02/2018 9:45 AM Medical Record Number: [id number] Patient Account Number: [id number] Date of Birth/Sex: 01/02/1991 (27 y.o. F) Treating RN: Phillis Haggis Primary Care Aiana Nordquist: Sherrie Mustache Other Clinician: Referring Andru Genter: Sherrie Mustache Treating Kynadie Yaun/Extender: Altamese [place] in Treatment: 0 Edema Assessment Assessed: [Left: No] [Right: No] Edema: [Left: N] [Right: o] Vascular Assessment Pulses: Dorsalis Pedis Palpable: [Left:Yes] Doppler Audible: [Left:Yes] Posterior Tibial Palpable: [Left:Yes] Doppler Audible: [Left:Yes] Extremity colors, hair growth, and conditions: Extremity Color: [Left:Normal] Hair Growth on Extremity: [Left:Yes] Temperature of Extremity: [Left:Cool] Capillary Refill: [Left:> 3 seconds] Blood Pressure: Brachial: [Left:108] Dorsalis Pedis: 132 [Left:Dorsalis Pedis:] Ankle: Posterior Tibial: 138 [Left:Posterior Tibial: 1.28] Toe Nail Assessment Left: Right: Thick: No Discolored: No Deformed: No Improper Length and Hygiene: No Electronic Signature(s) Signed: 02/02/2018 4:02:05 PM By: Alejandro Mulling Entered By: Alejandro Mulling on 02/02/2018 10:08:40 Marilyn Cline ([id number]) -------------------------------------------------------------------------------- Multi Wound  Chart Details Patient Name: Marilyn Cline. Date of Service: 02/02/2018 9:45 AM Medical Record Number: [id number] Patient Account Number: [id number] Date of Birth/Sex: [birth year] (27 y.o. F) Treating RN: Huel Coventry Primary Care Glayds Insco: Sherrie Mustache Other Clinician: Referring Armel Rabbani: Sherrie Mustache Treating George Alcantar/Extender: Altamese [place] in Treatment: 0 Vital Signs Height(in): 70 Pulse(bpm): 67 Weight(lbs): 162 Blood Pressure(mmHg): 118/59 Body Mass Index(BMI): 23 Temperature(F): 98.0 Respiratory Rate 18 (breaths/min): Photos: [1:No Photos] [N/A:N/A] Wound Location: [1:Left Foot - Dorsal] [N/A:N/A] Wounding Event: [1:Thermal Burn] [N/A:N/A] Primary Etiology: [1:2nd degree Burn] [N/A:N/A] Date Acquired: [1:12/22/2017] [N/A:N/A] Weeks of Treatment: [1:0] [N/A:N/A] Wound Status: [1:Open] [N/A:N/A] Measurements L x W x D [1:0.1x0.1x0.1] [N/A:N/A] (cm) Area (cm) : [1:0.008] [N/A:N/A] Volume (  cm) : [1:0.001] [N/A:N/A] Classification: [1:Partial Thickness] [N/A:N/A] Exudate Amount: [1:Medium] [N/A:N/A] Exudate Type: [1:Serous] [N/A:N/A] Exudate Color: [1:amber] [N/A:N/A] Wound Margin: [1:Distinct, outline attached] [N/A:N/A] Granulation Amount: [1:Small (1-33%)] [N/A:N/A] Granulation Quality: [1:Red] [N/A:N/A] Necrotic Amount: [1:Large (67-100%)] [N/A:N/A] Exposed Structures: [1:Fascia: No Fat Layer (Subcutaneous Tissue) Exposed: No Tendon: No Muscle: No Joint: No Bone: No] [N/A:N/A] Epithelialization: [1:None] [N/A:N/A] Periwound Skin Texture: [1:No Abnormalities Noted] [N/A:N/A] Periwound Skin Moisture: [1:No Abnormalities Noted] [N/A:N/A] Periwound Skin Color: [1:No Abnormalities Noted] [N/A:N/A] Temperature: [1:No Abnormality] [N/A:N/A] Tenderness on Palpation: [1:Yes] [N/A:N/A] Wound Preparation: [1:Ulcer Cleansing: Rinsed/Irrigated with Saline] [N/A:N/A] Topical Anesthetic Applied: Other: lidocaine 4% Marilyn Cline, Marilyn Cline ([id number]) Treatment  Notes Electronic Signature(s) Signed: 02/02/2018 5:44:56 PM By: Elliot Gurney, BSN, RN, CWS, Kim RN, BSN Entered By: Elliot Gurney, BSN, RN, CWS, Kim on 02/02/2018 10:42:51 Marilyn Cline ([id number]) -------------------------------------------------------------------------------- Multi-Disciplinary Care Plan Details Patient Name: Marilyn Cline, Marilyn Cline. Date of Service: 02/02/2018 9:45 AM Medical Record Number: [id number] Patient Account Number: [id number] Date of Birth/Sex: 1991/06/10 (27 y.o. F) Treating RN: Huel Coventry Primary Care Shavonda Wiedman: Sherrie Mustache Other Clinician: Referring Deira Shimer: Sherrie Mustache Treating Quintus Premo/Extender: Altamese [place] in Treatment: 0 Active Inactive Electronic Signature(s) Signed: 02/02/2018 5:44:56 PM By: Elliot Gurney, BSN, RN, CWS, Kim RN, BSN Entered By: Elliot Gurney, BSN, RN, CWS, Kim on 02/02/2018 10:42:23 Marilyn Cline ([id number]) -------------------------------------------------------------------------------- Pain Assessment Details Patient Name: Marilyn Cline, Marilyn Cline. Date of Service: 02/02/2018 9:45 AM Medical Record Number: [id number] Patient Account Number: [id number] Date of Birth/Sex: 1990-11-03 (27 y.o. F) Treating RN: Phillis Haggis Primary Care Conya Ellinwood: Sherrie Mustache Other Clinician: Referring Arth Nicastro: Sherrie Mustache Treating Tymber Stallings/Extender: Altamese [place] in Treatment: 0 Active Problems Location of Pain Severity and Description of Pain Patient Has Paino Yes Site Locations Rate the pain. Current Pain Level: 7 Character of Pain Describe the Pain: Aching, Burning Pain Management and Medication Current Pain Management: Notes Topical or injectable lidocaine is offered to patient for acute pain when surgical debridement is performed. If needed, Patient is instructed to use over the counter pain medication for the following 24-48 hours after debridement. Wound care MDs do not prescribed pain medications. Patient has chronic pain or  uncontrolled pain. Patient has been instructed to make an appointment with their Primary Care Physician for pain management. Electronic Signature(s) Signed: 02/02/2018 4:02:05 PM By: Alejandro Mulling Entered By: Alejandro Mulling on 02/02/2018 09:51:24 Marilyn Cline ([id number]) -------------------------------------------------------------------------------- Patient/Caregiver Education Details Patient Name: Marilyn Cline Date of Service: 02/02/2018 9:45 AM Medical Record Number: [id number] Patient Account Number: [id number] Date of Birth/Gender: 09/11/1990 (27 y.o. F) Treating RN: Huel Coventry Primary Care Physician: Sherrie Mustache Other Clinician: Referring Physician: Sherrie Mustache Treating Physician/Extender: Altamese [place] in Treatment: 0 Education Assessment Education Provided To: Patient Education Topics Provided Notes follow up with PCP if needed Electronic Signature(s) Signed: 02/02/2018 5:44:56 PM By: Elliot Gurney, BSN, RN, CWS, Kim RN, BSN Entered By: Elliot Gurney, BSN, RN, CWS, Kim on 02/02/2018 11:49:43 Marilyn Cline ([id number]) -------------------------------------------------------------------------------- Vitals Details Patient Name: Marilyn Cline Date of Service: 02/02/2018 9:45 AM Medical Record Number: [id number] Patient Account Number: [id number] Date of Birth/Sex: Dec 04, 1990 (27 y.o. F) Treating RN: Phillis Haggis Primary Care Travonta Gill: Sherrie Mustache Other Clinician: Referring Saphronia Ozdemir: Sherrie Mustache Treating Stpehen Petitjean/Extender: Altamese [place] in Treatment: 0 Vital Signs Time Taken: 09:51 Temperature (F): 98.0 Height (in): 70 Pulse (bpm): 67 Source: Stated Respiratory Rate (breaths/min): 18 Weight (lbs): 162 Blood Pressure (mmHg): 118/59 Source: Measured Reference Range: 80 - 120 mg / dl Body Mass Index (BMI): 23.2 Electronic  Signature(s) Signed: 02/02/2018 4:02:05 PM By: Alejandro MullingPinkerton, Debra Entered By: Alejandro MullingPinkerton, Debra on 02/02/2018  09:54:27

## 2018-02-09 ENCOUNTER — Ambulatory Visit: Payer: Self-pay | Admitting: Internal Medicine

## 2018-07-29 ENCOUNTER — Other Ambulatory Visit: Payer: Self-pay | Admitting: Sports Medicine

## 2018-07-29 DIAGNOSIS — G8929 Other chronic pain: Secondary | ICD-10-CM

## 2018-07-29 DIAGNOSIS — M545 Low back pain, unspecified: Secondary | ICD-10-CM

## 2018-08-14 ENCOUNTER — Ambulatory Visit
Admission: RE | Admit: 2018-08-14 | Discharge: 2018-08-14 | Disposition: A | Discharge status: Home / Self Care | Payer: 59 | Source: Ambulatory Visit | Attending: Sports Medicine | Admitting: Sports Medicine

## 2018-08-14 DIAGNOSIS — G8929 Other chronic pain: Secondary | ICD-10-CM

## 2018-08-14 DIAGNOSIS — M545 Low back pain, unspecified: Secondary | ICD-10-CM

## 2018-10-17 ENCOUNTER — Encounter (HOSPITAL_COMMUNITY): Payer: Self-pay

## 2018-10-17 ENCOUNTER — Emergency Department (HOSPITAL_COMMUNITY)
Admission: EM | Admit: 2018-10-17 | Discharge: 2018-10-17 | Disposition: A | Discharge status: Home / Self Care | Payer: 59 | Attending: Emergency Medicine | Admitting: Emergency Medicine

## 2018-10-17 ENCOUNTER — Other Ambulatory Visit: Payer: Self-pay

## 2018-10-17 DIAGNOSIS — Z79899 Other long term (current) drug therapy: Secondary | ICD-10-CM | POA: Diagnosis not present

## 2018-10-17 DIAGNOSIS — E039 Hypothyroidism, unspecified: Secondary | ICD-10-CM | POA: Insufficient documentation

## 2018-10-17 DIAGNOSIS — M79604 Pain in right leg: Secondary | ICD-10-CM | POA: Diagnosis not present

## 2018-10-17 DIAGNOSIS — F17210 Nicotine dependence, cigarettes, uncomplicated: Secondary | ICD-10-CM | POA: Diagnosis not present

## 2018-10-17 NOTE — ED Provider Notes (Signed)
[facility] EMERGENCY DEPARTMENT Provider Note   CSN: [id number] Arrival date & time: 10/17/18  1955    History   Chief Complaint Chief Complaint  Patient presents with  . Leg Pain    HPI Marilyn Cline is a 28 y.o. female with past medical history significant for endometriosis, hyperlipidemia, thyroidism who presents for evaluation of right leg pain.  Patient states she has had pain located to her right posterior calf x24 hours.  No known injuries or trauma.  Patient states her pain was initially started out as a "cramp.",  However has been persistent since this morning.  Patient was seen by urgent care today and was referred to emergency department for ultrasound to rule out a DVT.  Patient states she has had some mild swelling to her right posterior calf.  No redness or warmth.  She is on OCPs.  No recent surgeries, recent mobilization, history malignancy.  Denies fever, chills, nausea, vomiting, chest pain, cough, hemoptysis, crease range of motion or numbness or tingling in her extremities.  Denies family history of DVTs.   Rates her current pain a 5/10.  Pain does not radiate.  History obtained from patient family.  No interpreter was used.    HPI  Past Medical History:  Diagnosis Date  . Endometriosis   . Fatigue   . Goiter   . Hyperlipidemia   . Hypothyroidism   . Vitamin D deficiency     Patient Active Problem List   Diagnosis Date Noted  . Abnormal uterine bleeding 12/23/2016  . Dysmenorrhea 12/23/2016  . Irregular periods 12/23/2016  . Pain in pelvis 12/23/2016    Past Surgical History:  Procedure Laterality Date  . ENDOMETRIAL ABLATION    . TONSILLECTOMY    . WISDOM TOOTH EXTRACTION       OB History   No obstetric history on file.      Home Medications    Prior to Admission medications   Medication Sig Start Date End Date Taking? Authorizing Provider  Ciclopirox 1 % shampoo Apply 1 application topically once.    [provider]  clindamycin (CLEOCIN) 150 MG capsule Take 150 mg by mouth 1 day or 1 dose.    [provider]  clobetasol (TEMOVATE) 0.05 % external solution Apply 1 application topically once.    [provider]  DASETTA 1/35 tablet  11/13/16   [provider]  doxycycline (VIBRAMYCIN) 100 MG capsule  12/08/16   [provider]  Fluocinolone Acetonide Scalp 0.01 % OIL Apply 1 application topically once.    [provider]  hydrocortisone (PROCTO-MED HC) 2.5 % rectal cream Apply 1 application topically once.    [provider]  ibuprofen (ADVIL,MOTRIN) 200 MG tablet Take 400 mg by mouth every 6 (six) hours as needed for headache or moderate pain.    [provider]  ibuprofen (ADVIL,MOTRIN) 800 MG tablet Take 1 tablet by mouth every 8 (eight) hours.    [provider]  Marcille Blanco 0.15-0.03 MG tablet  11/12/16   [provider]  levonorgestrel-ethinyl estradiol (JOLESSA) 0.15-0.03 MG tablet Take 1 tablet by mouth daily.    [provider]  levonorgestrel-ethinyl estradiol (KURVELO) 0.15-30 MG-MCG tablet Take 1 tablet by mouth daily.    [provider]  mesalamine (CANASA) 1000 MG suppository Place 1,000 mg rectally once.    [provider]  methylphenidate 10 MG ER tablet Take 1 tablet by mouth daily.    [provider]  naproxen (NAPROSYN) 375 MG tablet Take 375 mg by mouth daily as needed for moderate pain.    [provider]  naproxen sodium (ANAPROX DS) 550 MG tablet Take 1 tablet (550 mg total) by mouth 2 (two) times daily with a meal. 02/24/14   Jean Rosenthal, NP  norethindrone-ethinyl estradiol 1/35 (NORTREL 1/35, 28,) tablet Take 1 tablet by mouth daily.    [provider]  polyethylene glycol-electrolytes (GAVILYTE-N WITH FLAVOR PACK) 420 g solution Take 1 application by mouth once.    [provider]  triamcinolone cream (KENALOG) 0.1 % Apply 1  application topically once.    [provider]    Family History Family History  Problem Relation Age of Onset  . Diabetes Maternal Grandmother   . Hypertension Maternal Grandmother   . Diabetes Maternal Grandfather   . Hypertension Maternal Grandfather   . Hypertension Mother   . Hypertension Father     Social History Social History   Tobacco Use  . Smoking status: Current Every Day Smoker  . Smokeless tobacco: Never Used  Substance Use Topics  . Alcohol use: No  . Drug use: No     Allergies   Amoxicillin; Penicillin g; Sulfa antibiotics; Penicillins cross reactors; and Sulfa drugs cross reactors   Review of Systems Review of Systems  Constitutional: Negative.   HENT: Negative.   Respiratory: Negative.   Cardiovascular: Negative.   Gastrointestinal: Negative.   Genitourinary: Negative.   Musculoskeletal:       Right calf pain.  Skin: Negative.   Neurological: Negative.   All other systems reviewed and are negative.    Physical Exam Updated Vital Signs BP 109/72 (BP Location: Right Arm)   Pulse 72   Temp 98.4 F (36.9 C) (Oral)   Resp 18   Ht 5\' 10"  (1.778 m)   Wt 72.6 kg   SpO2 100%   BMI 22.96 kg/m   Physical Exam Vitals signs and nursing note reviewed.  Constitutional:      General: She is not in acute distress.    Appearance: She is well-developed.  HENT:     Head: Atraumatic.     Nose: Nose normal.     Mouth/Throat:     Mouth: Mucous membranes are moist.     Pharynx: Oropharynx is clear.  Eyes:     Pupils: Pupils are equal, round, and reactive to light.  Neck:     Musculoskeletal: Normal range of motion.  Cardiovascular:     Rate and Rhythm: Normal rate.     Pulses: Normal pulses.     Heart sounds: Normal heart sounds.  Pulmonary:     Effort: Pulmonary effort is normal. No respiratory distress.     Breath sounds: Normal breath sounds.  Abdominal:     General: Bowel sounds are normal. There is no distension.      Tenderness: There is no abdominal tenderness. There is no guarding or rebound.  Musculoskeletal: Normal range of motion.     Right knee: Normal.     Right ankle: Normal. Achilles tendon exhibits no pain, no defect and normal Thompson's test results.     Left ankle: Normal.     Right lower leg: She exhibits tenderness. She exhibits no deformity and no laceration. No edema.     Left lower leg: Normal.       Legs:     Right foot: Normal.     Comments: Tenderness to palpation to right posterior calf.  No tenderness to  knee, tibia, fibula, ankle or foot.  Right calf measures 14, left calf 13.7.  No bony tenderness.  No obvious signs of injury.  Lower extremity compartments are soft.  No evidence of effusions.  Able to ambulate without difficulty.  Intact Achilles tendon.  Skin:    General: Skin is warm and dry.     Comments: No erythema or warmth to bilateral lower extremities.  Brisk capillary refill.  No induration or fluctuance noted.  Neurological:     Mental Status: She is alert.    ED Treatments / Results  Labs (all labs ordered are listed, but only abnormal results are displayed) Labs Reviewed - No data to display  EKG None  Radiology No results found.  Procedures Procedures (including critical care time)  Medications Ordered in ED Medications - No data to display   Initial Impression / Assessment and Plan / ED Course  I have reviewed the triage vital signs and the nursing notes.  Pertinent labs & imaging results that were available during my care of the patient were reviewed by me and considered in my medical decision making (see chart for details).  28 year old female appears otherwise well presents for evaluation of right calf pain.  Afebrile, nonseptic, non-ill-appearing.  Seen by urgent care earlier today and sent to emergency department to r/o DVT.  Unfortunately we do not have ultrasound present at this time.  Patient with tenderness to right calf.  Mild right calf  swelling, 0.3 cm larger on right.  No appreciable warmth or redness.  Is currently on OCPs.  No recent travel or recent mobilization.  No history of DVT.  No bony tenderness.  Able to ambulate without difficulty.  Lower extremity compartments are soft.  No evidence of effusions.  Achilles tendon intact.  No acute tachycardia, tachypnea or hypoxia.  No pleuritic chest pain, shortness of breath or hemoptysis.  Low suspicion for PE, myositis, septic joint, cellulitis.  Patient will be scheduled for outpatient DVT study in the morning.  Patient given instructions at DC for follow-up.  Patient hemodynamically stable and appropriate for DC home at this time.  Discussed strict return precautions.  Patient and family voiced understanding and are agreeable for follow-up.     Final Clinical Impressions(s) / ED Diagnoses   Final diagnoses:  Right leg pain    ED Discharge Orders         Ordered    LE VENOUS     10/17/18 2042           Amaiya Scruton A, PA-C 10/17/18 2306    Virgina Norfolk, DO 10/18/18 0116

## 2018-10-17 NOTE — ED Triage Notes (Signed)
Pt arrives POV for eval of R posterior calf pain onset last evening. Reports she thought it was just a cramp but the pain worsened throughout the day until volleyball practice. Seen at [facility], sent here for r/o DVT.

## 2018-10-17 NOTE — ED Notes (Signed)
Pt seen by PA Britni, eval'd and deemed safe to return for [place] study tmrw AM. Pt DC'd from waiting room by this RN. Pt verbalizes understanding of return tmrw AM. NARD on DC

## 2018-10-17 NOTE — Discharge Instructions (Signed)
Your evaluated today for possible blood clot.  Please follow the instructions for an outpatient Doppler ultrasound.  This will take place at 8 AM tomorrow morning at [facility].  You will be directed to radiology for this.  If you develop chest pain with breathing or coughing up blood please return to emergency department.

## 2018-10-18 ENCOUNTER — Ambulatory Visit (HOSPITAL_COMMUNITY)
Admission: RE | Admit: 2018-10-18 | Discharge: 2018-10-18 | Disposition: A | Discharge status: Home / Self Care | Payer: No Typology Code available for payment source | Source: Ambulatory Visit | Attending: Physician Assistant | Admitting: Physician Assistant

## 2018-10-18 DIAGNOSIS — M79662 Pain in left lower leg: Secondary | ICD-10-CM | POA: Diagnosis present

## 2018-10-18 DIAGNOSIS — M79609 Pain in unspecified limb: Secondary | ICD-10-CM | POA: Diagnosis not present

## 2018-10-18 NOTE — Progress Notes (Signed)
Right lower extremity venous duplex completed. Preliminary results in Chart review CV Proc. 10/18/2018 8:55 AM Toma Deiters, RVS

## 2019-05-08 ENCOUNTER — Ambulatory Visit (LOCAL_COMMUNITY_HEALTH_CENTER): Payer: Self-pay

## 2019-05-08 ENCOUNTER — Other Ambulatory Visit: Payer: Self-pay

## 2019-05-08 DIAGNOSIS — Z111 Encounter for screening for respiratory tuberculosis: Secondary | ICD-10-CM

## 2019-05-11 ENCOUNTER — Other Ambulatory Visit: Payer: Self-pay

## 2019-05-11 ENCOUNTER — Ambulatory Visit (LOCAL_COMMUNITY_HEALTH_CENTER): Payer: Self-pay

## 2019-05-11 DIAGNOSIS — Z111 Encounter for screening for respiratory tuberculosis: Secondary | ICD-10-CM

## 2019-05-11 LAB — TB SKIN TEST
Induration: 9 mm
TB Skin Test: NEGATIVE
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
## 2019-11-04 ENCOUNTER — Ambulatory Visit: Payer: Self-pay

## 2019-11-04 ENCOUNTER — Ambulatory Visit: Payer: PRIVATE HEALTH INSURANCE | Attending: Internal Medicine

## 2019-11-04 DIAGNOSIS — Z23 Encounter for immunization: Secondary | ICD-10-CM

## 2019-11-04 NOTE — Progress Notes (Signed)
   Covid-19 Vaccination Clinic  Name:  TREAZURE NERY    MRN: [id number] DOB: 10/30/1990  11/04/2019  Ms. Roseanne Reno was observed post Covid-19 immunization for 15 minutes without incident. She was provided with Vaccine Information Sheet and instruction to access the V-Safe system.   Ms. Roseanne Reno was instructed to call 911 with any severe reactions post vaccine: Marland Kitchen Difficulty breathing  . Swelling of face and throat  . A fast heartbeat  . A bad rash all over body  . Dizziness and weakness   Immunizations Administered    Name Date Dose VIS Date Route   Pfizer COVID-19 Vaccine 11/04/2019 12:35 PM 0.3 mL 07/21/2019 Intramuscular   Manufacturer: [employer], [employer]   Lot: [id number]   NDC: [address]-5

## 2020-05-07 ENCOUNTER — Other Ambulatory Visit: Payer: Self-pay

## 2020-05-07 ENCOUNTER — Ambulatory Visit: Payer: BC Managed Care – PPO | Admitting: Podiatry

## 2020-05-07 ENCOUNTER — Ambulatory Visit (INDEPENDENT_AMBULATORY_CARE_PROVIDER_SITE_OTHER): Payer: BC Managed Care – PPO

## 2020-05-07 DIAGNOSIS — S9032XA Contusion of left foot, initial encounter: Secondary | ICD-10-CM

## 2020-05-07 MED ORDER — DICLOFENAC SODIUM 75 MG PO TBEC: 75.0000 mg | DELAYED_RELEASE_TABLET | Freq: Two times a day (BID) | ORAL | 1 refills | Status: DC

## 2020-05-07 NOTE — Progress Notes (Signed)
   HPI: 29 y.o. female presenting today as a new patient for evaluation of pain and tenderness to the left foot.  Patient sustained an injury when a metal water bottle fell on the top of her left foot.  DOI: 05/01/2020.  Patient states that over the past week it continues to be painful and tender.  She has also noticed a lump sticking out on the top of her foot.  She is concerned is possibly bone.  She presents for further treatment and evaluation  Past Medical History:  Diagnosis Date  . Endometriosis   . Fatigue   . Goiter   . Hyperlipidemia   . Hypothyroidism   . Vitamin D deficiency      Physical Exam: General: The patient is alert and oriented x3 in no acute distress.  Dermatology: Skin is warm, dry and supple bilateral lower extremities. Negative for open lesions or macerations.  Vascular: Palpable pedal pulses bilaterally. No edema or erythema noted. Capillary refill within normal limits.  Neurological: Epicritic and protective threshold grossly intact bilaterally.   Musculoskeletal Exam: Range of motion within normal limits to all pedal and ankle joints bilateral. Muscle strength 5/5 in all groups bilateral.  There is tenderness to palpation as well as the noted subcutaneous semihard nodule within the deep tissues of the foot overlying the second metatarsal.  Possibly consistent with a deep hematoma or cyst.  Associated tenderness to palpation  Radiographic Exam:  Normal osseous mineralization. Joint spaces preserved. No fracture/dislocation/boney destruction.  I am unable to identify any fracture associated to the injury  Assessment: 1.  Negative for fracture 2.  Possible deep hematoma or cyst left dorsal foot   Plan of Care:  1. Patient evaluated. X-Rays reviewed.  2.  At the moment regarding recommend conservative care.  Prescription for diclofenac 75 mg 2 times daily 3.  Return to clinic in 8 weeks.  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 Felecia Shelling, DPM    [address] [place], [place] [address]                Office [phone]  Fax [phone]

## 2020-07-09 ENCOUNTER — Ambulatory Visit: Payer: BC Managed Care – PPO | Admitting: Podiatry

## 2020-07-09 ENCOUNTER — Ambulatory Visit (INDEPENDENT_AMBULATORY_CARE_PROVIDER_SITE_OTHER): Payer: BC Managed Care – PPO

## 2020-07-09 ENCOUNTER — Other Ambulatory Visit: Payer: Self-pay

## 2020-07-09 DIAGNOSIS — S92325D Nondisplaced fracture of second metatarsal bone, left foot, subsequent encounter for fracture with routine healing: Secondary | ICD-10-CM | POA: Diagnosis not present

## 2020-07-09 DIAGNOSIS — S9032XA Contusion of left foot, initial encounter: Secondary | ICD-10-CM | POA: Diagnosis not present

## 2020-07-13 NOTE — Progress Notes (Signed)
   HPI: 29 y.o. female presenting today for follow-up evaluation regarding an injury that was sustained when a metal water bottle fell on top of her left foot.  DOI: 05/01/2020.  Patient was last seen in the office on 05/07/2020.  Patient states that she still has some intermittent tenderness and pain to the foot.  Last visit x-rays were taken which were negative for fracture.  She presents for follow-up treatment evaluation  Past Medical History:  Diagnosis Date  . Endometriosis   . Fatigue   . Goiter   . Hyperlipidemia   . Hypothyroidism   . Vitamin D deficiency      Physical Exam: General: The patient is alert and oriented x3 in no acute distress.  Dermatology: Skin is warm, dry and supple bilateral lower extremities. Negative for open lesions or macerations.  Vascular: Palpable pedal pulses bilaterally. No edema or erythema noted. Capillary refill within normal limits.  Neurological: Epicritic and protective threshold grossly intact bilaterally.   Musculoskeletal Exam: Range of motion within normal limits to all pedal and ankle joints bilateral. Muscle strength 5/5 in all groups bilateral.   Radiographic Exam:  Normal osseous mineralization. Joint spaces preserved.  Today on radiographic exam there does demonstrate some cortical disruption and radiolucency consistent with a small stress fracture to the second metatarsal of the left foot.  These findings were not visible on prior exam.  Patient positive for fracture of the second metatarsal  Assessment: 1.  Fracture second metatarsal left foot with routine healing, subsequent encounter   Plan of Care:  1. Patient evaluated. X-Rays reviewed.  2.  Explained to the patient that if this injury will simply take time and it should resolve on its own. 3.  Patient also complains of a symptomatic lump or lesion deep within the tissue overlying the second metatarsal.  Explained to the patient that hopefully this will resolve on its own as  well. 4.  Return to clinic as needed.  If the patient still feels the symptomatic lump and it is bothering her, recommend returning to the clinic for surgical consultation to remove the soft tissue mass  *Getting married 10/19/2020      Felecia Shelling, DPM [facility]  Dr. Felecia Shelling, DPM    [address] [place], [place] [address]                Office [phone]  Fax [phone]

## 2021-09-25 LAB — OB RESULTS CONSOLE ABO/RH: RH Type: POSITIVE

## 2021-09-25 LAB — OB RESULTS CONSOLE HEPATITIS B SURFACE ANTIGEN: Hepatitis B Surface Ag: NEGATIVE

## 2021-09-25 LAB — OB RESULTS CONSOLE RUBELLA ANTIBODY, IGM: Rubella: IMMUNE

## 2021-09-25 LAB — OB RESULTS CONSOLE ANTIBODY SCREEN: Antibody Screen: NEGATIVE

## 2021-09-25 LAB — HEPATITIS C ANTIBODY: HCV Ab: NEGATIVE

## 2021-09-25 LAB — OB RESULTS CONSOLE RPR: RPR: NONREACTIVE

## 2021-09-25 LAB — OB RESULTS CONSOLE HIV ANTIBODY (ROUTINE TESTING): HIV: NONREACTIVE

## 2021-10-24 ENCOUNTER — Ambulatory Visit (INDEPENDENT_AMBULATORY_CARE_PROVIDER_SITE_OTHER): Payer: 59

## 2021-10-24 ENCOUNTER — Other Ambulatory Visit: Payer: Self-pay

## 2021-10-24 ENCOUNTER — Ambulatory Visit: Payer: PRIVATE HEALTH INSURANCE

## 2021-10-24 ENCOUNTER — Encounter: Payer: Self-pay | Admitting: Cardiology

## 2021-10-24 ENCOUNTER — Ambulatory Visit (INDEPENDENT_AMBULATORY_CARE_PROVIDER_SITE_OTHER): Payer: 59 | Admitting: Cardiology

## 2021-10-24 VITALS — BP 118/74 | HR 84 | Ht 70.0 in | Wt 187.0 lb

## 2021-10-24 DIAGNOSIS — R002 Palpitations: Secondary | ICD-10-CM

## 2021-10-24 DIAGNOSIS — R0602 Shortness of breath: Secondary | ICD-10-CM

## 2021-10-24 NOTE — Progress Notes (Signed)
Cardio-Obstetrics Clinic  New Evaluation  Date:  10/26/2021   ID:  Marilyn Cline, DOB 1990-12-31, MRN [id number]  PCP:  Sherrie Mustache, MD   [facility] Providers Cardiologist:  None  Electrophysiologist:  None       Referring MD: Maxie Better, MD   Chief Complaint: " I am experiencing shortness of breath"  History of Present Illness:    Marilyn Cline is a 31 y.o. female [G1P0] who is being seen today for the evaluation of  at the request of Maxie Better, MD.  She has a medical history of hyperlipidemia, hypothyroidism and vitamin D deficiency, this is her first pregnancy she is currently 14 weeks and 6 days.  She is here today to be evaluated for worsening shortness of breath as well as palpitations.  The patient is here with her husband.  They both tell me that she has been experiencing intermittent progression of shortness of breath.  She noticed that even now stairs that she used to climb without any issues but recently when she goes up the stairs she gets significantly short of breath out of proportion.  The palpitations also comes.  She was referred by her OB/GYN given the fact that she had significant murmur.  Prior CV Studies Reviewed: The following studies were reviewed today:   Past Medical History:  Diagnosis Date   Endometriosis    Fatigue    Goiter    Hyperlipidemia    Hypothyroidism    Vitamin D deficiency     Past Surgical History:  Procedure Laterality Date   ENDOMETRIAL ABLATION     TONSILLECTOMY     WISDOM TOOTH EXTRACTION        OB History     Gravida  1   Para      Term      Preterm      AB      Living         SAB      IAB      Ectopic      Multiple      Live Births                  Current Medications: Current Meds  Medication Sig   Ferrous Sulfate (IRON PO) Take 150 mg by mouth daily.   Prenatal Vit-Fe Fumarate-FA (PRENATAL VITAMIN PO) Take by mouth daily.   vitamin C (ASCORBIC ACID) 500 MG  tablet Take 500 mg by mouth daily.     Allergies:   Amoxicillin, Penicillin g, Sulfa antibiotics, Penicillins cross reactors, and Sulfa drugs cross reactors   Social History   Socioeconomic History   Marital status: Single    Spouse name: Not on file   Number of children: Not on file   Years of education: Not on file   Highest education level: Not on file  Occupational History   Not on file  Tobacco Use   Smoking status: Never    Passive exposure: Never   Smokeless tobacco: Never  Substance and Sexual Activity   Alcohol use: No   Drug use: No   Sexual activity: Yes    Birth control/protection: None  Other Topics Concern   Not on file  Social History Narrative   Not on file   Social Determinants of Health   Financial Resource Strain: Not on file  Food Insecurity: No Food Insecurity   Worried About Running Out of Food in the Last Year: Never true   Ran Out of  Food in the Last Year: Never true  Transportation Needs: No Transportation Needs   Lack of Transportation (Medical): No   Lack of Transportation (Non-Medical): No  Physical Activity: Not on file  Stress: Not on file  Social Connections: Not on file      Family History  Problem Relation Age of Onset   Diabetes Maternal Grandmother    Hypertension Maternal Grandmother    Diabetes Maternal Grandfather    Hypertension Maternal Grandfather    Hypertension Mother    Hypertension Father       ROS:   Review of Systems  Constitution: Negative for decreased appetite, fever and weight gain.  HENT: Negative for congestion, ear discharge, hoarse voice and sore throat.   Eyes: Negative for discharge, redness, vision loss in right eye and visual halos.  Cardiovascular: Reports dyspnea on exertion and palpitations.  Negative for chest pain, leg swelling, orthopnea  Respiratory: Negative for cough, hemoptysis, shortness of breath and snoring.   Endocrine: Negative for heat intolerance and polyphagia.   Hematologic/Lymphatic: Negative for bleeding problem. Does not bruise/bleed easily.  Skin: Negative for flushing, nail changes, rash and suspicious lesions.  Musculoskeletal: Negative for arthritis, joint pain, muscle cramps, myalgias, neck pain and stiffness.  Gastrointestinal: Negative for abdominal pain, bowel incontinence, diarrhea and excessive appetite.  Genitourinary: Negative for decreased libido, genital sores and incomplete emptying.  Neurological: Negative for brief paralysis, focal weakness, headaches and loss of balance.  Psychiatric/Behavioral: Negative for altered mental status, depression and suicidal ideas.  Allergic/Immunologic: Negative for HIV exposure and persistent infections.     Labs/EKG Reviewed:    EKG:   EKG is was not ordered today.    Recent Labs: No results found for requested labs within last 8760 hours.   Recent Lipid Panel No results found for: CHOL, TRIG, HDL, CHOLHDL, LDLCALC, LDLDIRECT  Physical Exam:    VS:  BP 118/74   Pulse 84   Ht 5\' 10"  (1.778 m)   Wt 187 lb (84.8 kg)   LMP 06/16/2021   SpO2 99%   BMI 26.83 kg/m     Wt Readings from Last 3 Encounters:  10/24/21 187 lb (84.8 kg)  10/17/18 160 lb (72.6 kg)  12/22/17 167 lb (75.8 kg)     GEN:  Well nourished, well developed in no acute distress HEENT: Normal NECK: No JVD; No carotid bruits LYMPHATICS: No lymphadenopathy CARDIAC: RRR, 3/6 holosystolic murmurs, rubs, gallops RESPIRATORY:  Clear to auscultation without rales, wheezing or rhonchi  ABDOMEN: Soft, non-tender, non-distended MUSCULOSKELETAL:  No edema; No deformity  SKIN: Warm and dry NEUROLOGIC:  Alert and oriented x 3 PSYCHIATRIC:  Normal affect    Risk Assessment/Risk Calculators:     CARPREG II Risk Prediction Index Score:  1.  The patient's risk for a primary cardiac event is 5%.   Modified World Health Organization [facility]) Classification of Maternal CV Risk   Class I         ASSESSMENT & PLAN:     Shortness of breath Cardiac murmur Palpitations  Despite the fact that systolic murmur are expected in pregnancy, clinically the patient murmur intensity is more significant than what is expected during pregnancy as well as her symptoms of shortness of breath with premature valvular surgery in her grandmother raising suspicion that bicuspid valve may have played a role I like to proceed with an echocardiogram in this patient to make sure that there is no structural abnormalities which will also help was understand LV function.  I would  like to rule out a cardiovascular etiology of this palpitation, therefore at this time I would like to placed a zio patch for  14  days.   Once I have further information of her diagnostic testing we will share with the patient and make further treatment plan accordingly.  Follow-up in 8 weeks.   Patient Instructions  Medication Instructions:  Your physician recommends that you continue on your current medications as directed. Please refer to the Current Medication list given to you today.  *If you need a refill on your cardiac medications before your next appointment, please call your pharmacy*   Lab Work: None If you have labs (blood work) drawn today and your tests are completely normal, you will receive your results only by: MyChart Message (if you have MyChart) OR A paper copy in the mail If you have any lab test that is abnormal or we need to change your treatment, we will call you to review the results.   Testing/Procedures: Your physician has requested that you have an echocardiogram. Echocardiography is a painless test that uses sound waves to create images of your heart. It provides your doctor with information about the size and shape of your heart and how well your hearts chambers and valves are working. This procedure takes approximately one hour. There are no restrictions for this procedure.  ZIO XT- Long Term Monitor  Instructions  Your physician has requested you wear a ZIO patch monitor for 14 days.  This is a single patch monitor. [employer] supplies one patch monitor per enrollment. Additional stickers are not available. Please do not apply patch if you will be having a Nuclear Stress Test,  Echocardiogram, Cardiac CT, MRI, or Chest Xray during the period you would be wearing the  monitor. The patch cannot be worn during these tests. You cannot remove and re-apply the  ZIO XT patch monitor.  Your ZIO patch monitor will be mailed 3 day USPS to your address on file. It may take 3-5 days  to receive your monitor after you have been enrolled.  Once you have received your monitor, please review the enclosed instructions. Your monitor  has already been registered assigning a specific monitor serial # to you.  Billing and Patient Assistance Program Information  We have supplied [employer] with any of your insurance information on file for billing purposes. [employer] offers a sliding scale Patient Assistance Program for patients that do not have  insurance, or whose insurance does not completely cover the cost of the ZIO monitor.  You must apply for the Patient Assistance Program to qualify for this discounted rate.  To apply, please call [employer] at [phone], select option 4, select option 2, ask to apply for  Patient Assistance Program. Meredeth Ide will ask your household income, and how many people  are in your household. They will quote your out-of-pocket cost based on that information.  [employer] will also be able to set up a 37-month, interest-free payment plan if needed.  If your monitor falls off in less than 4 days, contact our Monitor department at [phone].  If your monitor becomes loose or falls off after 4 days call [employer] at [phone] for  suggestions on securing your monitor    Follow-Up: At [facility], you and your health needs are our priority.  As part of our continuing mission to  provide you with exceptional heart care, we have created designated Provider Care Teams.  These Care Teams include your primary Cardiologist (physician) and Advanced Practice  Providers (APPs -  Physician Assistants and Nurse Practitioners) who all work together to provide you with the care you need, when you need it.  We recommend signing up for the patient portal called "MyChart".  Sign up information is provided on this After Visit Summary.  MyChart is used to connect with patients for Virtual Visits (Telemedicine).  Patients are able to view lab/test results, encounter notes, upcoming appointments, etc.  Non-urgent messages can be sent to your provider as well.   To learn more about what you can do with MyChart, go to [email or web address].    Your next appointment:   8 week(s)  The format for your next appointment:   In Person  Provider:   Thomasene Ripple, DO [address], [place], [place] [address]  Or  [facility] [address], [place], [place] [address]   Other Instructions    Dispo:  Return in about 8 weeks (around 12/19/2021).   Medication Adjustments/Labs and Tests Ordered: Current medicines are reviewed at length with the patient today.  Concerns regarding medicines are outlined above.  Tests Ordered: Orders Placed This Encounter  Procedures   LONG TERM MONITOR (3-14 DAYS)   ECHOCARDIOGRAM COMPLETE   Medication Changes: No orders of the defined types were placed in this encounter.

## 2021-10-24 NOTE — Patient Instructions (Addendum)
Medication Instructions:  ?Your physician recommends that you continue on your current medications as directed. Please refer to the Current Medication list given to you today.  ?*If you need a refill on your cardiac medications before your next appointment, please call your pharmacy* ? ? ?Lab Work: ?None ?If you have labs (blood work) drawn today and your tests are completely normal, you will receive your results only by: ?MyChart Message (if you have MyChart) OR ?A paper copy in the mail ?If you have any lab test that is abnormal or we need to change your treatment, we will call you to review the results. ? ? ?Testing/Procedures: ?Your physician has requested that you have an echocardiogram. Echocardiography is a painless test that uses sound waves to create images of your heart. It provides your doctor with information about the size and shape of your heart and how well your heart?s chambers and valves are working. This procedure takes approximately one hour. There are no restrictions for this procedure. ? ?ZIO XT- Long Term Monitor Instructions ? ?Your physician has requested you wear a ZIO patch monitor for 14 days.  ?This is a single patch monitor. [employer] supplies one patch monitor per enrollment. Additional ?stickers are not available. Please do not apply patch if you will be having a Nuclear Stress Test,  ?Echocardiogram, Cardiac CT, MRI, or Chest Xray during the period you would be wearing the  ?monitor. The patch cannot be worn during these tests. You cannot remove and re-apply the  ?ZIO XT patch monitor.  ?Your ZIO patch monitor will be mailed 3 day USPS to your address on file. It may take 3-5 days  ?to receive your monitor after you have been enrolled.  ?Once you have received your monitor, please review the enclosed instructions. Your monitor  ?has already been registered assigning a specific monitor serial # to you. ? ?Billing and Patient Assistance Program Information ? ?We have supplied [employer] with  any of your insurance information on file for billing purposes. ?[employer] offers a sliding scale Patient Assistance Program for patients that do not have  ?insurance, or whose insurance does not completely cover the cost of the ZIO monitor.  ?You must apply for the Patient Assistance Program to qualify for this discounted rate.  ?To apply, please call [employer] at [phone], select option 4, select option 2, ask to apply for  ?Patient Assistance Program. Meredeth Ide will ask your household income, and how many people  ?are in your household. They will quote your out-of-pocket cost based on that information.  ?[employer] will also be able to set up a 65-month, interest-free payment plan if needed. ? ?If your monitor falls off in less than 4 days, contact our Monitor department at [phone].  ?If your monitor becomes loose or falls off after 4 days call [employer] at [phone] for  ?suggestions on securing your monitor ? ? ? ?Follow-Up: ?At [facility], you and your health needs are our priority.  As part of our continuing mission to provide you with exceptional heart care, we have created designated Provider Care Teams.  These Care Teams include your primary Cardiologist (physician) and Advanced Practice Providers (APPs -  Physician Assistants and Nurse Practitioners) who all work together to provide you with the care you need, when you need it. ? ?We recommend signing up for the patient portal called "MyChart".  Sign up information is provided on this After Visit Summary.  MyChart is used to connect with patients for Virtual Visits (Telemedicine).  Patients  are able to view lab/test results, encounter notes, upcoming appointments, etc.  Non-urgent messages can be sent to your provider as well.   ?To learn more about what you can do with MyChart, go to [email or web address].   ? ?Your next appointment:   ?8 week(s) ? ?The format for your next appointment:   ?In Person ? ?Provider:   ?Thomasene Ripple, DO ?[address], [place], [place] [address] ? Or  ?Kardie Tobb  ?[facility] ?[address] [place], [place] [address] ? ? ?Other Instructions ?  ?

## 2021-10-31 ENCOUNTER — Telehealth: Payer: Self-pay

## 2021-10-31 NOTE — Telephone Encounter (Signed)
Spoke with pt to let her know the monitor she has on needs to be replaced with another monitor. She verbalized understanding. She states she has an appt Monday for her echo. Will contact monitor department to have pt added to the schedule on Monday.  ?

## 2021-10-31 NOTE — Addendum Note (Signed)
Addended by: Reynolds Bowl on: 10/31/2021 09:31 AM ? ? Modules accepted: Orders ? ?

## 2021-10-31 NOTE — Telephone Encounter (Signed)
Called pt to discuss a matter with her monitor. No answer at this time, left a message for her to return the call.  ?

## 2021-10-31 NOTE — Telephone Encounter (Signed)
Called pt to let her know she will be getting a monitor mailed to her and it will be applied when Monday when she goes to the [address] office on Monday. She verbalized understanding.  ?

## 2021-11-03 ENCOUNTER — Encounter: Payer: Self-pay | Admitting: *Deleted

## 2021-11-03 ENCOUNTER — Other Ambulatory Visit: Payer: Self-pay

## 2021-11-03 ENCOUNTER — Ambulatory Visit (HOSPITAL_COMMUNITY): Payer: 59 | Attending: Cardiology

## 2021-11-03 DIAGNOSIS — R0602 Shortness of breath: Secondary | ICD-10-CM | POA: Insufficient documentation

## 2021-11-03 LAB — ECHOCARDIOGRAM COMPLETE
Area-P 1/2: 3.37 cm2
S' Lateral: 3.3 cm

## 2021-11-03 NOTE — Progress Notes (Signed)
Patient ID: Marilyn Cline, female   DOB: [birth year], 31 y.o.   MRN: [id number] ?Patient states she was contacted by Meredeth Ide and told her ZIO XT from Dr. Servando Salina at [facility], monitor serial # [id number] was expired. ? ?Patient given replacement monitor serial # [id number] from [facility] office. ? ?[employer] notified ?

## 2021-12-25 ENCOUNTER — Telehealth: Payer: Self-pay

## 2021-12-25 NOTE — Telephone Encounter (Signed)
Called to confirm appt tomorrow. No answer. Left message to return call.

## 2021-12-26 ENCOUNTER — Ambulatory Visit (INDEPENDENT_AMBULATORY_CARE_PROVIDER_SITE_OTHER): Payer: 59 | Admitting: Cardiology

## 2021-12-26 ENCOUNTER — Encounter: Payer: Self-pay | Admitting: Cardiology

## 2021-12-26 VITALS — BP 120/76 | HR 72 | Ht 70.0 in | Wt 204.0 lb

## 2021-12-26 DIAGNOSIS — R0602 Shortness of breath: Secondary | ICD-10-CM | POA: Diagnosis not present

## 2021-12-26 DIAGNOSIS — I471 Supraventricular tachycardia: Secondary | ICD-10-CM | POA: Diagnosis not present

## 2021-12-26 MED ORDER — PROPRANOLOL HCL 10 MG PO TABS: 10.0000 mg | ORAL_TABLET | Freq: Every day | ORAL | 1 refills | Status: DC

## 2021-12-26 NOTE — Patient Instructions (Signed)
Medication Instructions:  Your physician has recommended you make the following change in your medication:  START: Propanolol 10mg  Daily AT NIGHT   *If you need a refill on your cardiac medications before your next appointment, please call your pharmacy*   Lab Work: NONE If you have labs (blood work) drawn today and your tests are completely normal, you will receive your results only by: [place] (if you have MyChart) OR A paper copy in the mail If you have any lab test that is abnormal or we need to change your treatment, we will call you to review the results.   Testing/Procedures: NONE   Follow-Up: At [facility], you and your health needs are our priority.  As part of our continuing mission to provide you with exceptional heart care, we have created designated Provider Care Teams.  These Care Teams include your primary Cardiologist (physician) and Advanced Practice Providers (APPs -  Physician Assistants and Nurse Practitioners) who all work together to provide you with the care you need, when you need it.  We recommend signing up for the patient portal called "MyChart".  Sign up information is provided on this After Visit Summary.  MyChart is used to connect with patients for Virtual Visits (Telemedicine).  Patients are able to view lab/test results, encounter notes, upcoming appointments, etc.  Non-urgent messages can be sent to your provider as well.   To learn more about what you can do with MyChart, go to [email or web address].    Your next appointment:   12 week(s)  The format for your next appointment:   In Person  Provider:   Berniece Salines DO    Important Information About Sugar

## 2021-12-26 NOTE — Progress Notes (Signed)
Cardio-Obstetrics Clinic  Follow Up Note   Date:  12/26/2021   ID:  Marilyn Cline, DOB 09-Apr-1991, MRN [id number]  PCP:  Durenda Hurt, MD   [facility] Providers Cardiologist:  Thomasene Ripple, DO  Electrophysiologist:  None        Referring MD: Sherrie Mustache, MD   Chief Complaint: " I am still experiencing palpitations and shortness of breath"  History of Present Illness:    Marilyn Cline is a 31 y.o. female [G1P0] who returns for follow up of palpitations and shortness of breath.  Her medical history includes Hyperlipidemia, Hypothyroidism, Vitamin D Deficiency.  She was initially seen October 24, 2021 at that time she was experiencing significant palpitations shortness of breath.  Her shortness of breath was clinically out of proportion therefore an echocardiogram was done.  She did get the echocardiogram that shows normal.  Her ZIO monitor showed evidence of rare paroxysmal SVT.  She still is experiencing symptoms.   Prior CV Studies Reviewed: The following studies were reviewed today:  ZIO monitor Patch Wear Time:  20 days and 16 hours (2023-03-17T16:42:26-0400 to 2023-04-10T08:31:46-0400)   Monitor 1 Patient had a min HR of 55 bpm, max HR of 157 bpm, and avg HR of 83 bpm. Predominant underlying rhythm was Sinus Rhythm. Isolated SVEs were rare (<1.0%), SVE Triplets were rare (<1.0%), and no SVE Couplets were present. Isolated VEs were rare (<1.0%),  and no VE Couplets or VE Triplets were present.    Monitor 2 Patient had a min HR of 52 bpm, max HR of 166 bpm, and avg HR of 80 bpm. Predominant underlying rhythm was Sinus Rhythm. Slight P wave morphology changes were noted. 1 run of Supraventricular Tachycardia occurred lasting 5 beats with a max rate of 158  bpm (avg 152 bpm). Supraventricular Tachycardia was detected within +/- 45 seconds of symptomatic patient event(s). Isolated SVEs were rare (<1.0%), SVE Couplets were rare (<1.0%), and SVE Triplets were rare (<1.0%).  Isolated VEs were rare (<1.0%), and  no VE Couplets or VE Triplets were present.     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   11/03/2021 IMPRESSIONS     1. Left ventricular ejection fraction, by estimation, is 55 to 60%. Left  ventricular ejection fraction by 3D volume is 60 %. The left ventricle has  normal function. The left ventricle has no regional wall motion  abnormalities. Left ventricular diastolic   parameters were normal. The average left ventricular global longitudinal  strain is -27.1 %. The global longitudinal strain is normal.   2. Right ventricular systolic function is normal. The right ventricular  size is normal.   3. The mitral valve is normal in structure. Trivial mitral valve  regurgitation.   4. The aortic valve is tricuspid. Aortic valve regurgitation is not  visualized. No aortic stenosis is present.   5. The inferior vena cava is normal in size with greater than 50%  respiratory variability, suggesting right atrial pressure of 3 mmHg.   Comparison(s): No prior Echocardiogram.   FINDINGS   Left Ventricle: Left ventricular ejection fraction, by estimation, is 55  to 60%. Left ventricular ejection fraction by 3D volume is 60 %. The left  ventricle has normal function. The left ventricle has no regional wall  motion abnormalities. The average  left ventricular global longitudinal strain is -27.1 %. The global  longitudinal strain is normal. The left ventricular internal cavity size  was normal in size. There is no left ventricular  hypertrophy. Left  ventricular diastolic parameters were normal.   Right Ventricle: The right ventricular size is normal. No increase in  right ventricular wall thickness. Right ventricular systolic function is  normal.   Left Atrium: Left atrial size was normal in size.   Right Atrium: Right atrial size was normal in size.   Pericardium: There is no evidence of pericardial  effusion.   Mitral Valve: The mitral valve is normal in structure. Trivial mitral  valve regurgitation.   Tricuspid Valve: The tricuspid valve is normal in structure. Tricuspid  valve regurgitation is trivial.   Aortic Valve: The aortic valve is tricuspid. Aortic valve regurgitation is  not visualized. No aortic stenosis is present.   Pulmonic Valve: The pulmonic valve was normal in structure. Pulmonic valve  regurgitation is not visualized.   Aorta: The aortic root and ascending aorta are structurally normal, with  no evidence of dilitation.   Venous: The inferior vena cava is normal in size with greater than 50%  respiratory variability, suggesting right atrial pressure of 3 mmHg.   IAS/Shunts: The atrial septum is grossly normal.       Past Medical History:  Diagnosis Date   Endometriosis    Fatigue    Goiter    Hyperlipidemia    Hypothyroidism    Vitamin D deficiency     Past Surgical History:  Procedure Laterality Date   ENDOMETRIAL ABLATION     TONSILLECTOMY     WISDOM TOOTH EXTRACTION        OB History     Gravida  1   Para      Term      Preterm      AB      Living         SAB      IAB      Ectopic      Multiple      Live Births                  Current Medications: Current Meds  Medication Sig   Ferrous Sulfate (IRON PO) Take 150 mg by mouth daily.   Prenatal Vit-Fe Fumarate-FA (PRENATAL VITAMIN PO) Take by mouth daily.   propranolol (INDERAL) 10 MG tablet Take 1 tablet (10 mg total) by mouth at bedtime.   vitamin C (ASCORBIC ACID) 500 MG tablet Take 500 mg by mouth daily.     Allergies:   Amoxicillin, Penicillin g, Sulfa antibiotics, Penicillins cross reactors, and Sulfa drugs cross reactors   Social History   Socioeconomic History   Marital status: Single    Spouse name: Not on file   Number of children: Not on file   Years of education: Not on file   Highest education level: Not on file  Occupational History    Not on file  Tobacco Use   Smoking status: Never    Passive exposure: Never   Smokeless tobacco: Never  Substance and Sexual Activity   Alcohol use: No   Drug use: No   Sexual activity: Yes    Birth control/protection: None  Other Topics Concern   Not on file  Social History Narrative   Not on file   Social Determinants of Health   Financial Resource Strain: Not on file  Food Insecurity: No Food Insecurity   Worried About Running Out of Food in the Last Year: Never true   Ran Out of Food in the Last Year: Never true  Transportation Needs: No Transportation Needs  Lack of Transportation (Medical): No   Lack of Transportation (Non-Medical): No  Physical Activity: Not on file  Stress: Not on file  Social Connections: Not on file      Family History  Problem Relation Age of Onset   Diabetes Maternal Grandmother    Hypertension Maternal Grandmother    Diabetes Maternal Grandfather    Hypertension Maternal Grandfather    Hypertension Mother    Hypertension Father       ROS:   Review of Systems  Constitution: Negative for decreased appetite, fever and weight gain.  HENT: Negative for congestion, ear discharge, hoarse voice and sore throat.   Eyes: Negative for discharge, redness, vision loss in right eye and visual halos.  Cardiovascular: Reports palpations and dyspnea on exertion. Negative for chest pain, leg swelling, orthopnea . Respiratory: Negative for cough, hemoptysis, shortness of breath and snoring.   Endocrine: Negative for heat intolerance and polyphagia.  Hematologic/Lymphatic: Negative for bleeding problem. Does not bruise/bleed easily.  Skin: Negative for flushing, nail changes, rash and suspicious lesions.  Musculoskeletal: Negative for arthritis, joint pain, muscle cramps, myalgias, neck pain and stiffness.  Gastrointestinal: Negative for abdominal pain, bowel incontinence, diarrhea and excessive appetite.  Genitourinary: Negative for decreased libido,  genital sores and incomplete emptying.  Neurological: Negative for brief paralysis, focal weakness, headaches and loss of balance.  Psychiatric/Behavioral: Negative for altered mental status, depression and suicidal ideas.  Allergic/Immunologic: Negative for HIV exposure and persistent infections.     Labs/EKG Reviewed:    EKG:   EKG is was ordered today.    Recent Labs: No results found for requested labs within last 8760 hours.   Recent Lipid Panel No results found for: CHOL, TRIG, HDL, CHOLHDL, LDLCALC, LDLDIRECT  Physical Exam:    VS:  BP 120/76   Pulse 72   Ht 5\' 10"  (1.778 m)   Wt 204 lb (92.5 kg)   LMP 06/16/2021   SpO2 98%   BMI 29.27 kg/m     Wt Readings from Last 3 Encounters:  12/26/21 204 lb (92.5 kg)  10/24/21 187 lb (84.8 kg)  10/17/18 160 lb (72.6 kg)     GEN:  Well nourished, well developed in no acute distress HEENT: Normal NECK: No JVD; No carotid bruits LYMPHATICS: No lymphadenopathy CARDIAC: RRR, 3/6 holosystolic murmurs, rubs, gallops RESPIRATORY:  Clear to auscultation without rales, wheezing or rhonchi  ABDOMEN: Soft, non-tender, non-distended MUSCULOSKELETAL:  No edema; No deformity  SKIN: Warm and dry NEUROLOGIC:  Alert and oriented x 3 PSYCHIATRIC:  Normal affect    Risk Assessment/Risk Calculators:     CARPREG II Risk Prediction Index Score:  1.  The patient's risk for a primary cardiac event is 5%.            ASSESSMENT & PLAN:    Rare SVT Palpitation Shortness of breath  She is still experiencing symptoms of palpitations mostly at night.  With this she developed shortness of breath.  I will like to start the patient on low-dose propanolol 10 mg at nighttime.  Hopefully this will help with her symptoms.  The patient is in agreement with the above plan. The patient left the office in stable condition.  The patient will follow up in 12 weeks.  Patient Instructions  Medication Instructions:  Your physician has recommended  you make the following change in your medication:  START: Propanolol 10mg  Daily AT NIGHT   *If you need a refill on your cardiac medications before your next appointment, please  call your pharmacy*   Lab Work: NONE If you have labs (blood work) drawn today and your tests are completely normal, you will receive your results only by: MyChart Message (if you have MyChart) OR A paper copy in the mail If you have any lab test that is abnormal or we need to change your treatment, we will call you to review the results.   Testing/Procedures: NONE   Follow-Up: At [facility], you and your health needs are our priority.  As part of our continuing mission to provide you with exceptional heart care, we have created designated Provider Care Teams.  These Care Teams include your primary Cardiologist (physician) and Advanced Practice Providers (APPs -  Physician Assistants and Nurse Practitioners) who all work together to provide you with the care you need, when you need it.  We recommend signing up for the patient portal called "MyChart".  Sign up information is provided on this After Visit Summary.  MyChart is used to connect with patients for Virtual Visits (Telemedicine).  Patients are able to view lab/test results, encounter notes, upcoming appointments, etc.  Non-urgent messages can be sent to your provider as well.   To learn more about what you can do with MyChart, go to [email or web address].    Your next appointment:   12 week(s)  The format for your next appointment:   In Person  Provider:   Thomasene Ripple DO    Important Information About Sugar        Dispo:  Return in about 12 weeks (around 03/20/2022).   Medication Adjustments/Labs and Tests Ordered: Current medicines are reviewed at length with the patient today.  Concerns regarding medicines are outlined above.  Tests Ordered: No orders of the defined types were placed in this encounter.  Medication Changes: Meds  ordered this encounter  Medications   propranolol (INDERAL) 10 MG tablet    Sig: Take 1 tablet (10 mg total) by mouth at bedtime.    Dispense:  90 tablet    Refill:  1

## 2022-01-27 ENCOUNTER — Telehealth: Payer: Self-pay | Admitting: Cardiology

## 2022-01-27 NOTE — Telephone Encounter (Signed)
Spoke with Dr. Maxie Better, who requested last office visit and last echo from Dr. Servando Salina. These were faxed to [phone].

## 2022-01-27 NOTE — Telephone Encounter (Signed)
Dr. Wonda Olds calling to get a couple of the patient echo. Please advise   Fax number [phone]

## 2022-02-04 ENCOUNTER — Encounter: Payer: Self-pay | Admitting: Podiatry

## 2022-02-04 ENCOUNTER — Ambulatory Visit (INDEPENDENT_AMBULATORY_CARE_PROVIDER_SITE_OTHER): Payer: 59 | Admitting: Podiatry

## 2022-02-04 DIAGNOSIS — S9031XA Contusion of right foot, initial encounter: Secondary | ICD-10-CM

## 2022-02-04 NOTE — Progress Notes (Signed)
She presents today states that she hit her foot on the couch last week and then hit it again this past Monday.  She states my foot is swollen and my toes hurt.  It is hard for me to bend my toes that she refers to the fourth and fifth digits of the right foot.  She states that it was black and blue initially it improved and then she reinjured it this past Monday she states that it bothers me trying to wear shoe and standing for long.  Times and that it starts to throb.  She states that she is [redacted] weeks pregnant.  Objective: Vital signs are stable alert oriented x3 she has edema overlying the third fourth fifth metatarsal phalangeal joint area with pain on palpation to the fourth and fifth digits and the third fourth and fifth metatarsals.  She has good muscle tone dorsiflexion plantarflexion inversion eversion neurologic sensorium is intact.  Radiographs obviously not performed today secondary to pregnancy.  Assessment: Cannot rule out possible fracture of the metatarsals or the toes in question.  Plan: At this point we will put her in a compression anklet and a short cam walker would like her to stay on this until we can follow-up with her until she resolves this.

## 2022-03-18 LAB — OB RESULTS CONSOLE GBS: GBS: NEGATIVE

## 2022-03-20 ENCOUNTER — Encounter: Payer: Self-pay | Admitting: Cardiology

## 2022-03-20 ENCOUNTER — Ambulatory Visit (INDEPENDENT_AMBULATORY_CARE_PROVIDER_SITE_OTHER): Payer: 59 | Admitting: Cardiology

## 2022-03-20 VITALS — BP 114/70 | HR 71 | Ht 70.0 in | Wt 218.2 lb

## 2022-03-20 DIAGNOSIS — R002 Palpitations: Secondary | ICD-10-CM

## 2022-03-20 DIAGNOSIS — Z3402 Encounter for supervision of normal first pregnancy, second trimester: Secondary | ICD-10-CM

## 2022-03-20 NOTE — Patient Instructions (Signed)
Medication Instructions:  Your physician recommends that you continue on your current medications as directed. Please refer to the Current Medication list given to you today.  *If you need a refill on your cardiac medications before your next appointment, please call your pharmacy*   Lab Work: None If you have labs (blood work) drawn today and your tests are completely normal, you will receive your results only by: MyChart Message (if you have MyChart) OR A paper copy in the mail If you have any lab test that is abnormal or we need to change your treatment, we will call you to review the results.   Testing/Procedures: None   Follow-Up: At [facility], you and your health needs are our priority.  As part of our continuing mission to provide you with exceptional heart care, we have created designated Provider Care Teams.  These Care Teams include your primary Cardiologist (physician) and Advanced Practice Providers (APPs -  Physician Assistants and Nurse Practitioners) who all work together to provide you with the care you need, when you need it.  We recommend signing up for the patient portal called "MyChart".  Sign up information is provided on this After Visit Summary.  MyChart is used to connect with patients for Virtual Visits (Telemedicine).  Patients are able to view lab/test results, encounter notes, upcoming appointments, etc.  Non-urgent messages can be sent to your provider as well.   To learn more about what you can do with MyChart, go to [email or web address].    Your next appointment:   Nov 7th at 10:40am  The format for your next appointment:   Virtual Visit   Provider:   Thomasene Ripple, DO     Other Instructions   Important Information About Sugar

## 2022-03-20 NOTE — Progress Notes (Signed)
Cardio-Obstetrics Clinic  Follow Up Note   Date:  03/20/2022   ID:  Marilyn Cline, DOB [birth year], MRN [id number]  PCP:  Durenda Hurt, MD   [facility] Providers Cardiologist:  Thomasene Ripple, DO  Electrophysiologist:  None        Referring MD: Durenda Hurt, MD   Chief Complaint: " I am doing well"  History of Present Illness:    Marilyn Cline is a 31 y.o. female [G1P0] who returns for follow up of cardiovascular care in pregnancy.  Her medical history includes Hyperlipidemia, Hypothyroidism, Vitamin D Deficiency.  She was initially seen October 24, 2021 at that time she was experiencing significant palpitations shortness of breath.  Her shortness of breath was clinically out of proportion therefore an echocardiogram was done.  She did get the echocardiogram that shows normal.  Her ZIO monitor showed evidence of rare paroxysmal SVT.  At her visit on Dec 25, 2021 she still was experiencing symptoms I started the patient on propranolol.  She tells me she has been doing well with this.  No other complaints at this time.  Prior CV Studies Reviewed: The following studies were reviewed today:  ZIO monitor Patch Wear Time:  20 days and 16 hours (2023-03-17T16:42:26-0400 to 2023-04-10T08:31:46-0400)   Monitor 1 Patient had a min HR of 55 bpm, max HR of 157 bpm, and avg HR of 83 bpm. Predominant underlying rhythm was Sinus Rhythm. Isolated SVEs were rare (<1.0%), SVE Triplets were rare (<1.0%), and no SVE Couplets were present. Isolated VEs were rare (<1.0%),  and no VE Couplets or VE Triplets were present.    Monitor 2 Patient had a min HR of 52 bpm, max HR of 166 bpm, and avg HR of 80 bpm. Predominant underlying rhythm was Sinus Rhythm. Slight P wave morphology changes were noted. 1 run of Supraventricular Tachycardia occurred lasting 5 beats with a max rate of 158  bpm (avg 152 bpm). Supraventricular Tachycardia was detected within +/- 45 seconds of symptomatic patient event(s).  Isolated SVEs were rare (<1.0%), SVE Couplets were rare (<1.0%), and SVE Triplets were rare (<1.0%). Isolated VEs were rare (<1.0%), and  no VE Couplets or VE Triplets were present. 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     11/03/2021 IMPRESSIONS   1. Left ventricular ejection fraction, by estimation, is 55 to 60%. Left  ventricular ejection fraction by 3D volume is 60 %. The left ventricle has  normal function. The left ventricle has no regional wall motion  abnormalities. Left ventricular diastolic   parameters were normal. The average left ventricular global longitudinal  strain is -27.1 %. The global longitudinal strain is normal.   2. Right ventricular systolic function is normal. The right ventricular  size is normal.   3. The mitral valve is normal in structure. Trivial mitral valve  regurgitation.   4. The aortic valve is tricuspid. Aortic valve regurgitation is not  visualized. No aortic stenosis is present.   5. The inferior vena cava is normal in size with greater than 50%  respiratory variability, suggesting right atrial pressure of 3 mmHg.   Comparison(s): No prior Echocardiogram.   FINDINGS   Left Ventricle: Left ventricular ejection fraction, by estimation, is 55  to 60%. Left ventricular ejection fraction by 3D volume is 60 %. The left  ventricle has normal function. The left ventricle has no regional wall  motion abnormalities. The average  left ventricular global longitudinal strain is -27.1 %. The global  longitudinal strain is normal. The left ventricular internal cavity size  was normal in size. There is no left ventricular hypertrophy. Left  ventricular diastolic parameters were normal.   Right Ventricle: The right ventricular size is normal. No increase in  right ventricular wall thickness. Right ventricular systolic function is  normal.   Left Atrium: Left atrial size was normal in size.   Right Atrium:  Right atrial size was normal in size.   Pericardium: There is no evidence of pericardial effusion.   Mitral Valve: The mitral valve is normal in structure. Trivial mitral  valve regurgitation.   Tricuspid Valve: The tricuspid valve is normal in structure. Tricuspid  valve regurgitation is trivial.   Aortic Valve: The aortic valve is tricuspid. Aortic valve regurgitation is  not visualized. No aortic stenosis is present.   Pulmonic Valve: The pulmonic valve was normal in structure. Pulmonic valve  regurgitation is not visualized.   Aorta: The aortic root and ascending aorta are structurally normal, with  no evidence of dilitation.   Venous: The inferior vena cava is normal in size with greater than 50%  respiratory variability, suggesting right atrial pressure of 3 mmHg.   IAS/Shunts: The atrial septum is grossly normal.   Past Medical History:  Diagnosis Date   Endometriosis    Fatigue    Goiter    Hyperlipidemia    Hypothyroidism    Vitamin D deficiency     Past Surgical History:  Procedure Laterality Date   ENDOMETRIAL ABLATION     TONSILLECTOMY     WISDOM TOOTH EXTRACTION        OB History     Gravida  1   Para      Term      Preterm      AB      Living         SAB      IAB      Ectopic      Multiple      Live Births                  Current Medications: Current Meds  Medication Sig   Ferrous Sulfate (IRON PO) Take 150 mg by mouth daily.   Prenatal Vit-Fe Fumarate-FA (PRENATAL VITAMIN PO) Take by mouth daily.   propranolol (INDERAL) 10 MG tablet Take 1 tablet (10 mg total) by mouth at bedtime.   vitamin C (ASCORBIC ACID) 500 MG tablet Take 500 mg by mouth daily.     Allergies:   Amoxicillin, Penicillin g, Sulfa antibiotics, Penicillins cross reactors, and Sulfa drugs cross reactors   Social History   Socioeconomic History   Marital status: Single    Spouse name: Not on file   Number of children: Not on file   Years of  education: Not on file   Highest education level: Not on file  Occupational History   Not on file  Tobacco Use   Smoking status: Never    Passive exposure: Never   Smokeless tobacco: Never  Substance and Sexual Activity   Alcohol use: No   Drug use: No   Sexual activity: Yes    Birth control/protection: None  Other Topics Concern   Not on file  Social History Narrative   Not on file   Social Determinants of Health   Financial Resource Strain: Not on file  Food Insecurity: No Food Insecurity (10/26/2021)   Hunger Vital Sign    Worried About Running Out of Food in  the Last Year: Never true    Ran Out of Food in the Last Year: Never true  Transportation Needs: No Transportation Needs (10/26/2021)   PRAPARE - Administrator, Civil Service (Medical): No    Lack of Transportation (Non-Medical): No  Physical Activity: Not on file  Stress: Not on file  Social Connections: Not on file      Family History  Problem Relation Age of Onset   Diabetes Maternal Grandmother    Hypertension Maternal Grandmother    Diabetes Maternal Grandfather    Hypertension Maternal Grandfather    Hypertension Mother    Hypertension Father       ROS:   Please see the history of present illness.     All other systems reviewed and are negative.   Labs/EKG Reviewed:    EKG:   EKG is was ordered today.   Recent Labs: No results found for requested labs within last 365 days.   Recent Lipid Panel No results found for: "CHOL", "TRIG", "HDL", "CHOLHDL", "LDLCALC", "LDLDIRECT"  Physical Exam:    VS:  BP 114/70   Pulse 71   Ht 5\' 10"  (1.778 m)   Wt 218 lb 3.2 oz (99 kg)   LMP 06/16/2021   SpO2 100%   BMI 31.31 kg/m     Wt Readings from Last 3 Encounters:  03/20/22 218 lb 3.2 oz (99 kg)  12/26/21 204 lb (92.5 kg)  10/24/21 187 lb (84.8 kg)     GEN:  Well nourished, well developed in no acute distress HEENT: Normal NECK: No JVD; No carotid bruits LYMPHATICS: No  lymphadenopathy CARDIAC: RRR, no murmurs, rubs, gallops RESPIRATORY:  Clear to auscultation without rales, wheezing or rhonchi  ABDOMEN: Soft, non-tender, non-distended MUSCULOSKELETAL:  No edema; No deformity  SKIN: Warm and dry NEUROLOGIC:  Alert and oriented x 3 PSYCHIATRIC:  Normal affect    Risk Assessment/Risk Calculators:     CARPREG II Risk Prediction Index Score:  1.  The patient's risk for a primary cardiac event is 5%.   Modified World Health Organization [facility]) Classification of Maternal CV Risk   Class I         ASSESSMENT & PLAN:    Paroxysmal SVT  She is tolerating the propranolol very well.  She is taking low-dose at nighttime.  Will continue this. Encouraged her to continue to take her blood pressure.  Especially in the postpartum time.   The patient is in agreement with the above plan. The patient left the office in stable condition.  The patient will follow up in 12 weeks postpartum virtually.  Patient Instructions  Medication Instructions:  Your physician recommends that you continue on your current medications as directed. Please refer to the Current Medication list given to you today.  *If you need a refill on your cardiac medications before your next appointment, please call your pharmacy*   Lab Work: None If you have labs (blood work) drawn today and your tests are completely normal, you will receive your results only by: MyChart Message (if you have MyChart) OR A paper copy in the mail If you have any lab test that is abnormal or we need to change your treatment, we will call you to review the results.   Testing/Procedures: None   Follow-Up: At [facility], you and your health needs are our priority.  As part of our continuing mission to provide you with exceptional heart care, we have created designated Provider Care Teams.  These Care  Teams include your primary Cardiologist (physician) and Advanced Practice Providers (APPs -  Physician  Assistants and Nurse Practitioners) who all work together to provide you with the care you need, when you need it.  We recommend signing up for the patient portal called "MyChart".  Sign up information is provided on this After Visit Summary.  MyChart is used to connect with patients for Virtual Visits (Telemedicine).  Patients are able to view lab/test results, encounter notes, upcoming appointments, etc.  Non-urgent messages can be sent to your provider as well.   To learn more about what you can do with MyChart, go to [email or web address].    Your next appointment:   Nov 7th at 10:40am  The format for your next appointment:   Virtual Visit   Provider:   Thomasene Ripple, DO     Other Instructions   Important Information About Sugar        Dispo:  No follow-ups on file.   Medication Adjustments/Labs and Tests Ordered: Current medicines are reviewed at length with the patient today.  Concerns regarding medicines are outlined above.  Tests Ordered: Orders Placed This Encounter  Procedures   EKG 12-Lead   Medication Changes: No orders of the defined types were placed in this encounter.

## 2022-04-02 ENCOUNTER — Encounter (HOSPITAL_COMMUNITY): Payer: Self-pay | Admitting: *Deleted

## 2022-04-02 NOTE — Patient Instructions (Signed)
AURIELLE SLINGERLAND  04/02/2022   Your procedure is scheduled on:  04/13/2022  Arrive at 0530 at Entrance C on [address] at [facility]  and [employer]. You are invited to use the FREE valet parking or use the Visitor's parking deck.  Pick up the phone at the desk and dial [phone].  Call this number if you have problems the morning of surgery: [phone]  Remember:   Do not eat food:(After Midnight) Desps de medianoche.  Do not drink clear liquids: (After Midnight) Desps de medianoche.  Take these medicines the morning of surgery with A SIP OF WATER:  Propanalol as prescribed   Do not wear jewelry, make-up or nail polish.  Do not wear lotions, powders, or perfumes. Do not wear deodorant.  Do not shave 48 hours prior to surgery.  Do not bring valuables to the hospital.  [facility] is not   responsible for any belongings or valuables brought to the hospital.  Contacts, dentures or bridgework may not be worn into surgery.  Leave suitcase in the car. After surgery it may be brought to your room.  For patients admitted to the hospital, checkout time is 11:00 AM the day of              discharge.      Please read over the following fact sheets that you were given:     Preparing for Surgery

## 2022-04-10 ENCOUNTER — Other Ambulatory Visit (HOSPITAL_COMMUNITY)
Admission: RE | Admit: 2022-04-10 | Discharge: 2022-04-10 | Disposition: A | Discharge status: Home / Self Care | Payer: 59 | Source: Ambulatory Visit | Attending: Family Medicine | Admitting: Family Medicine

## 2022-04-10 DIAGNOSIS — Z01812 Encounter for preprocedural laboratory examination: Secondary | ICD-10-CM | POA: Insufficient documentation

## 2022-04-10 DIAGNOSIS — Z01818 Encounter for other preprocedural examination: Secondary | ICD-10-CM

## 2022-04-10 HISTORY — DX: Arteriovenous malformation, site unspecified: Q27.30

## 2022-04-10 LAB — CBC
HCT: 34 % — ABNORMAL LOW (ref 36.0–46.0)
Hemoglobin: 11 g/dL — ABNORMAL LOW (ref 12.0–15.0)
MCH: 28.4 pg (ref 26.0–34.0)
MCHC: 32.4 g/dL (ref 30.0–36.0)
MCV: 87.6 fL (ref 80.0–100.0)
Platelets: 265 10*3/uL (ref 150–400)
RBC: 3.88 MIL/uL (ref 3.87–5.11)
RDW: 16.3 % — ABNORMAL HIGH (ref 11.5–15.5)
WBC: 8.9 10*3/uL (ref 4.0–10.5)
nRBC: 0 % (ref 0.0–0.2)

## 2022-04-10 LAB — RPR: RPR Ser Ql: NONREACTIVE

## 2022-04-10 LAB — TYPE AND SCREEN
ABO/RH(D): O POS
Antibody Screen: NEGATIVE

## 2022-04-11 NOTE — Anesthesia Preprocedure Evaluation (Signed)
Anesthesia Evaluation  Patient identified by MRN, date of birth, ID band Patient awake    Reviewed: Allergy & Precautions, NPO status , Patient's Chart, lab work & pertinent test results  History of Anesthesia Complications Negative for: history of anesthetic complications  Airway Mallampati: II  TM Distance: >3 FB Neck ROM: Full    Dental no notable dental hx.    Pulmonary neg pulmonary ROS,    Pulmonary exam normal        Cardiovascular Normal cardiovascular exam+ dysrhythmias Supra Ventricular Tachycardia      Neuro/Psych negative neurological ROS  negative psych ROS   GI/Hepatic negative GI ROS, Neg liver ROS,   Endo/Other  negative endocrine ROS  Renal/GU negative Renal ROS  negative genitourinary   Musculoskeletal negative musculoskeletal ROS (+)   Abdominal   Peds  Hematology negative hematology ROS (+)   Anesthesia Other Findings Day of surgery medications reviewed with patient.  Reproductive/Obstetrics (+) Pregnancy (breech)                            Anesthesia Physical Anesthesia Plan  ASA: 2  Anesthesia Plan: Spinal   Post-op Pain Management:    Induction:   PONV Risk Score and Plan: 4 or greater and Treatment may vary due to age or medical condition, Ondansetron and Dexamethasone  Airway Management Planned: Natural Airway  Additional Equipment: None  Intra-op Plan:   Post-operative Plan:   Informed Consent: I have reviewed the patients History and Physical, chart, labs and discussed the procedure including the risks, benefits and alternatives for the proposed anesthesia with the patient or authorized representative who has indicated his/her understanding and acceptance.       Plan Discussed with: CRNA  Anesthesia Plan Comments:        Anesthesia Quick Evaluation

## 2022-04-12 MED ORDER — DEXTROSE 5 % IV SOLN
5.0000 mg/kg | INTRAVENOUS | Status: AC
  Administered 2022-04-13: 408 mg via INTRAVENOUS
  Filled 2022-04-12: qty 10.25

## 2022-04-12 MED ORDER — METRONIDAZOLE 500 MG/100ML IV SOLN
500.0000 mg | INTRAVENOUS | Status: AC
  Administered 2022-04-13: 500 mg via INTRAVENOUS
  Filled 2022-04-12: qty 100

## 2022-04-13 ENCOUNTER — Inpatient Hospital Stay (HOSPITAL_COMMUNITY)
Admission: RE | Admit: 2022-04-13 | Discharge: 2022-04-15 | DRG: 788 | Disposition: A | Discharge status: Home / Self Care | Payer: 59 | Attending: Obstetrics and Gynecology | Admitting: Obstetrics and Gynecology

## 2022-04-13 ENCOUNTER — Encounter (HOSPITAL_COMMUNITY): Payer: Self-pay | Admitting: Obstetrics and Gynecology

## 2022-04-13 ENCOUNTER — Inpatient Hospital Stay (HOSPITAL_COMMUNITY): Payer: 59 | Admitting: Anesthesiology

## 2022-04-13 ENCOUNTER — Other Ambulatory Visit: Payer: Self-pay

## 2022-04-13 ENCOUNTER — Encounter (HOSPITAL_COMMUNITY)
Admission: RE | Disposition: A | Discharge status: Home / Self Care | Payer: Self-pay | Source: Home / Self Care | Attending: Obstetrics and Gynecology

## 2022-04-13 DIAGNOSIS — O328XX0 Maternal care for other malpresentation of fetus, not applicable or unspecified: Secondary | ICD-10-CM | POA: Diagnosis present

## 2022-04-13 DIAGNOSIS — O321XX0 Maternal care for breech presentation, not applicable or unspecified: Secondary | ICD-10-CM | POA: Diagnosis not present

## 2022-04-13 DIAGNOSIS — O9942 Diseases of the circulatory system complicating childbirth: Secondary | ICD-10-CM | POA: Diagnosis not present

## 2022-04-13 DIAGNOSIS — O9902 Anemia complicating childbirth: Secondary | ICD-10-CM | POA: Diagnosis present

## 2022-04-13 DIAGNOSIS — Z3A00 Weeks of gestation of pregnancy not specified: Secondary | ICD-10-CM | POA: Diagnosis not present

## 2022-04-13 DIAGNOSIS — O99892 Other specified diseases and conditions complicating childbirth: Secondary | ICD-10-CM | POA: Diagnosis present

## 2022-04-13 DIAGNOSIS — I471 Supraventricular tachycardia: Secondary | ICD-10-CM | POA: Diagnosis not present

## 2022-04-13 DIAGNOSIS — D509 Iron deficiency anemia, unspecified: Secondary | ICD-10-CM | POA: Diagnosis present

## 2022-04-13 DIAGNOSIS — Z3A39 39 weeks gestation of pregnancy: Secondary | ICD-10-CM | POA: Diagnosis not present

## 2022-04-13 DIAGNOSIS — R002 Palpitations: Secondary | ICD-10-CM | POA: Diagnosis present

## 2022-04-13 DIAGNOSIS — Z01818 Encounter for other preprocedural examination: Principal | ICD-10-CM

## 2022-04-13 LAB — ABO/RH: ABO/RH(D): O POS

## 2022-04-13 SURGERY — Surgical Case
Anesthesia: Spinal

## 2022-04-13 MED ORDER — DROPERIDOL 2.5 MG/ML IJ SOLN: 0.6250 mg | Freq: Once | INTRAMUSCULAR | Status: DC | PRN

## 2022-04-13 MED ORDER — FENTANYL CITRATE (PF) 100 MCG/2ML IJ SOLN
INTRAMUSCULAR | Status: AC
  Filled 2022-04-13: qty 2

## 2022-04-13 MED ORDER — MORPHINE SULFATE (PF) 0.5 MG/ML IJ SOLN
INTRAMUSCULAR | Status: AC
  Filled 2022-04-13: qty 10

## 2022-04-13 MED ORDER — STERILE WATER FOR IRRIGATION IR SOLN
Status: DC | PRN
  Administered 2022-04-13: 1000 mL

## 2022-04-13 MED ORDER — KETOROLAC TROMETHAMINE 30 MG/ML IJ SOLN
INTRAMUSCULAR | Status: AC
  Filled 2022-04-13: qty 2

## 2022-04-13 MED ORDER — COCONUT OIL OIL: 1.0000 | TOPICAL_OIL | Status: DC | PRN

## 2022-04-13 MED ORDER — PRENATAL MULTIVITAMIN CH
1.0000 | ORAL_TABLET | Freq: Every day | ORAL | Status: DC
  Administered 2022-04-14 – 2022-04-15 (×2): 1 via ORAL
  Filled 2022-04-13 (×2): qty 1

## 2022-04-13 MED ORDER — FENTANYL CITRATE (PF) 100 MCG/2ML IJ SOLN: 25.0000 ug | INTRAMUSCULAR | Status: DC | PRN

## 2022-04-13 MED ORDER — FENTANYL CITRATE (PF) 100 MCG/2ML IJ SOLN
INTRAMUSCULAR | Status: DC | PRN
  Administered 2022-04-13: 15 ug via INTRATHECAL

## 2022-04-13 MED ORDER — KETOROLAC TROMETHAMINE 30 MG/ML IJ SOLN: 30.0000 mg | Freq: Once | INTRAMUSCULAR | Status: DC

## 2022-04-13 MED ORDER — OXYCODONE HCL 5 MG PO TABS: 5.0000 mg | ORAL_TABLET | ORAL | Status: DC | PRN

## 2022-04-13 MED ORDER — SODIUM CHLORIDE 0.9 % IR SOLN
Status: DC | PRN
  Administered 2022-04-13: 1

## 2022-04-13 MED ORDER — DEXAMETHASONE SODIUM PHOSPHATE 10 MG/ML IJ SOLN
INTRAMUSCULAR | Status: DC | PRN
  Administered 2022-04-13: 5 mg via INTRAVENOUS

## 2022-04-13 MED ORDER — ACETAMINOPHEN 500 MG PO TABS
ORAL_TABLET | ORAL | Status: AC
  Filled 2022-04-13: qty 2

## 2022-04-13 MED ORDER — ZOLPIDEM TARTRATE 5 MG PO TABS: 5.0000 mg | ORAL_TABLET | Freq: Every evening | ORAL | Status: DC | PRN

## 2022-04-13 MED ORDER — POLYSACCHARIDE IRON COMPLEX 150 MG PO CAPS
150.0000 mg | ORAL_CAPSULE | Freq: Every evening | ORAL | Status: DC
  Administered 2022-04-13 – 2022-04-14 (×2): 150 mg via ORAL
  Filled 2022-04-13 (×2): qty 1

## 2022-04-13 MED ORDER — BUPIVACAINE IN DEXTROSE 0.75-8.25 % IT SOLN
INTRATHECAL | Status: DC | PRN
  Administered 2022-04-13: 1.6 mL via INTRATHECAL

## 2022-04-13 MED ORDER — MORPHINE SULFATE (PF) 0.5 MG/ML IJ SOLN
INTRAMUSCULAR | Status: DC | PRN
  Administered 2022-04-13: .15 mg via INTRATHECAL

## 2022-04-13 MED ORDER — ONDANSETRON HCL 4 MG/2ML IJ SOLN
INTRAMUSCULAR | Status: AC
  Filled 2022-04-13: qty 2

## 2022-04-13 MED ORDER — SIMETHICONE 80 MG PO CHEW: 80.0000 mg | CHEWABLE_TABLET | ORAL | Status: DC | PRN

## 2022-04-13 MED ORDER — OXYTOCIN-SODIUM CHLORIDE 30-0.9 UT/500ML-% IV SOLN
INTRAVENOUS | Status: DC | PRN
  Administered 2022-04-13: 30 [IU] via INTRAVENOUS

## 2022-04-13 MED ORDER — MENTHOL 3 MG MT LOZG: 1.0000 | LOZENGE | OROMUCOSAL | Status: DC | PRN

## 2022-04-13 MED ORDER — ONDANSETRON HCL 4 MG/2ML IJ SOLN
INTRAMUSCULAR | Status: DC | PRN
  Administered 2022-04-13: 4 mg via INTRAVENOUS

## 2022-04-13 MED ORDER — DEXAMETHASONE SODIUM PHOSPHATE 10 MG/ML IJ SOLN
INTRAMUSCULAR | Status: AC
  Filled 2022-04-13: qty 1

## 2022-04-13 MED ORDER — SOD CITRATE-CITRIC ACID 500-334 MG/5ML PO SOLN
ORAL | Status: AC
  Filled 2022-04-13: qty 30

## 2022-04-13 MED ORDER — ACETAMINOPHEN 500 MG PO TABS
1000.0000 mg | ORAL_TABLET | Freq: Four times a day (QID) | ORAL | Status: DC
  Administered 2022-04-13 – 2022-04-15 (×8): 1000 mg via ORAL
  Filled 2022-04-13 (×8): qty 2

## 2022-04-13 MED ORDER — LACTATED RINGERS IV SOLN: INTRAVENOUS | Status: DC

## 2022-04-13 MED ORDER — IBUPROFEN 600 MG PO TABS
600.0000 mg | ORAL_TABLET | Freq: Four times a day (QID) | ORAL | Status: DC
  Administered 2022-04-13 – 2022-04-15 (×7): 600 mg via ORAL
  Filled 2022-04-13 (×7): qty 1

## 2022-04-13 MED ORDER — OXYTOCIN-SODIUM CHLORIDE 30-0.9 UT/500ML-% IV SOLN: 2.5000 [IU]/h | INTRAVENOUS | Status: AC

## 2022-04-13 MED ORDER — SIMETHICONE 80 MG PO CHEW
80.0000 mg | CHEWABLE_TABLET | Freq: Three times a day (TID) | ORAL | Status: DC
  Administered 2022-04-14 – 2022-04-15 (×5): 80 mg via ORAL
  Filled 2022-04-13 (×4): qty 1

## 2022-04-13 MED ORDER — WITCH HAZEL-GLYCERIN EX PADS: 1.0000 | MEDICATED_PAD | CUTANEOUS | Status: DC | PRN

## 2022-04-13 MED ORDER — KETOROLAC TROMETHAMINE 30 MG/ML IJ SOLN
INTRAMUSCULAR | Status: DC | PRN
  Administered 2022-04-13: 30 mg via INTRAVENOUS

## 2022-04-13 MED ORDER — BUPIVACAINE HCL (PF) 0.25 % IJ SOLN
INTRAMUSCULAR | Status: AC
  Filled 2022-04-13: qty 10

## 2022-04-13 MED ORDER — ACETAMINOPHEN 160 MG/5ML PO SOLN: 1000.0000 mg | Freq: Once | ORAL | Status: AC

## 2022-04-13 MED ORDER — POVIDONE-IODINE 10 % EX SWAB
2.0000 | Freq: Once | CUTANEOUS | Status: AC
  Administered 2022-04-13: 2 via TOPICAL

## 2022-04-13 MED ORDER — NALBUPHINE HCL 10 MG/ML IJ SOLN: 5.0000 mg | Freq: Once | INTRAMUSCULAR | Status: DC

## 2022-04-13 MED ORDER — VITAMIN C 500 MG PO TABS
500.0000 mg | ORAL_TABLET | Freq: Every evening | ORAL | Status: DC
  Administered 2022-04-13 – 2022-04-14 (×2): 500 mg via ORAL
  Filled 2022-04-13 (×2): qty 1

## 2022-04-13 MED ORDER — PHENYLEPHRINE HCL-NACL 20-0.9 MG/250ML-% IV SOLN
INTRAVENOUS | Status: DC | PRN
  Administered 2022-04-13: 60 ug/min via INTRAVENOUS

## 2022-04-13 MED ORDER — DIPHENHYDRAMINE HCL 25 MG PO CAPS: 25.0000 mg | ORAL_CAPSULE | Freq: Four times a day (QID) | ORAL | Status: DC | PRN

## 2022-04-13 MED ORDER — SENNOSIDES-DOCUSATE SODIUM 8.6-50 MG PO TABS
2.0000 | ORAL_TABLET | ORAL | Status: DC
  Administered 2022-04-14 – 2022-04-15 (×2): 2 via ORAL
  Filled 2022-04-13 (×2): qty 2

## 2022-04-13 MED ORDER — SOD CITRATE-CITRIC ACID 500-334 MG/5ML PO SOLN
30.0000 mL | ORAL | Status: AC
  Administered 2022-04-13: 30 mL via ORAL

## 2022-04-13 MED ORDER — ACETAMINOPHEN 500 MG PO TABS
1000.0000 mg | ORAL_TABLET | Freq: Once | ORAL | Status: AC
  Administered 2022-04-13: 1000 mg via ORAL

## 2022-04-13 MED ORDER — DIBUCAINE (PERIANAL) 1 % EX OINT: 1.0000 | TOPICAL_OINTMENT | CUTANEOUS | Status: DC | PRN

## 2022-04-13 MED ORDER — PROPRANOLOL HCL 20 MG PO TABS
10.0000 mg | ORAL_TABLET | Freq: Every day | ORAL | Status: DC
  Administered 2022-04-13 – 2022-04-14 (×2): 10 mg via ORAL
  Filled 2022-04-13 (×2): qty 0.5
  Filled 2022-04-13: qty 1

## 2022-04-13 SURGICAL SUPPLY — 44 items
BARRIER ADHS 3X4 INTERCEED (GAUZE/BANDAGES/DRESSINGS) ×1 IMPLANT
BENZOIN TINCTURE PRP APPL 2/3 (GAUZE/BANDAGES/DRESSINGS) IMPLANT
CHLORAPREP W/TINT 26ML (MISCELLANEOUS) ×1 IMPLANT
CLAMP CORD UMBIL (MISCELLANEOUS) IMPLANT
CLOTH BEACON ORANGE TIMEOUT ST (SAFETY) ×1 IMPLANT
DRAPE C SECTION CLR SCREEN (DRAPES) ×1 IMPLANT
DRSG OPSITE POSTOP 4X10 (GAUZE/BANDAGES/DRESSINGS) ×1 IMPLANT
ELECT REM PT RETURN 9FT ADLT (ELECTROSURGICAL) ×1
ELECTRODE REM PT RTRN 9FT ADLT (ELECTROSURGICAL) ×1 IMPLANT
EXTRACTOR VACUUM M CUP 4 TUBE (SUCTIONS) IMPLANT
GLOVE BIOGEL PI IND STRL 7.0 (GLOVE) ×2 IMPLANT
GLOVE BIOGEL PI INDICATOR 7.0 (GLOVE) ×2
GLOVE ECLIPSE 6.5 STRL STRAW (GLOVE) ×1 IMPLANT
GOWN STRL REUS W/TWL LRG LVL3 (GOWN DISPOSABLE) ×2 IMPLANT
KIT ABG SYR 3ML LUER SLIP (SYRINGE) IMPLANT
NDL HYPO 25X5/8 SAFETYGLIDE (NEEDLE) IMPLANT
NEEDLE HYPO 22GX1.5 SAFETY (NEEDLE) ×1 IMPLANT
NEEDLE HYPO 25X5/8 SAFETYGLIDE (NEEDLE) IMPLANT
NS IRRIG 1000ML POUR BTL (IV SOLUTION) ×1 IMPLANT
PACK C SECTION WH (CUSTOM PROCEDURE TRAY) ×1 IMPLANT
PAD OB MATERNITY 4.3X12.25 (PERSONAL CARE ITEMS) ×1 IMPLANT
RTRCTR C-SECT PINK 25CM LRG (MISCELLANEOUS) IMPLANT
STRIP CLOSURE SKIN 1/2X4 (GAUZE/BANDAGES/DRESSINGS) IMPLANT
STRIP CLOSURE SKIN 1/4X4 (GAUZE/BANDAGES/DRESSINGS) IMPLANT
SUT CHROMIC GUT AB #0 18 (SUTURE) IMPLANT
SUT MNCRL 0 VIOLET CTX 36 (SUTURE) ×3 IMPLANT
SUT MON AB 2-0 SH 27 (SUTURE)
SUT MON AB 2-0 SH27 (SUTURE) IMPLANT
SUT MON AB 3-0 SH 27 (SUTURE)
SUT MON AB 3-0 SH27 (SUTURE) IMPLANT
SUT MON AB 4-0 PS1 27 (SUTURE) IMPLANT
SUT MONOCRYL 0 CTX 36 (SUTURE) ×3
SUT PLAIN 2 0 (SUTURE)
SUT PLAIN 2 0 XLH (SUTURE) IMPLANT
SUT PLAIN ABS 2-0 CT1 27XMFL (SUTURE) IMPLANT
SUT VIC AB 0 CT1 36 (SUTURE) ×2 IMPLANT
SUT VIC AB 2-0 CT1 27 (SUTURE) ×1
SUT VIC AB 2-0 CT1 TAPERPNT 27 (SUTURE) ×1 IMPLANT
SUT VIC AB 4-0 PS2 18 (SUTURE) IMPLANT
SUT VIC AB 4-0 PS2 27 (SUTURE) IMPLANT
SYR CONTROL 10ML LL (SYRINGE) ×1 IMPLANT
TOWEL OR 17X24 6PK STRL BLUE (TOWEL DISPOSABLE) ×1 IMPLANT
TRAY FOLEY W/BAG SLVR 14FR LF (SET/KITS/TRAYS/PACK) IMPLANT
WATER STERILE IRR 1000ML POUR (IV SOLUTION) ×1 IMPLANT

## 2022-04-13 NOTE — Brief Op Note (Signed)
04/13/2022  9:19 AM  PATIENT:  Marilyn Cline  30 y.o. female  PRE-OPERATIVE DIAGNOSIS:  breech, term gestation  POST-OPERATIVE DIAGNOSIS:  incomplete breech presentation, term gestation  PROCEDURE:  Primary Cesarean section, kerr hysterotomy  SURGEON:  Surgeon(s) and Role:    * Maxie Better, MD - Primary  PHYSICIAN ASSISTANT:   ASSISTANTS: none   ANESTHESIA:   spinal Findings: live female, incomplete. Nl tubes and ovaries, abnl placenta ? Bilobed vs succenturiate. No intracavitary lesion noted. 7lb 7 oz EBL:  1120 ml   BLOOD ADMINISTERED:none  DRAINS: none   LOCAL MEDICATIONS USED:  MARCAINE     SPECIMEN:  Source of Specimen:  placenta   DISPOSITION OF SPECIMEN:  PATHOLOGY  COUNTS:  YES  TOURNIQUET:  * No tourniquets in log *  DICTATION: .Other Dictation: Dictation Number [id number]  PLAN OF CARE: Admit to inpatient   PATIENT DISPOSITION:  PACU - hemodynamically stable.   Delay start of Pharmacological VTE agent (>24hrs) due to surgical blood loss or risk of bleeding: no

## 2022-04-13 NOTE — Interval H&P Note (Signed)
History and Physical Interval Note:  04/13/2022 9:45 AM  Marilyn Cline  has presented today for surgery, with the diagnosis of Malpresentation.  The various methods of treatment have been discussed with the patient and family. After consideration of risks, benefits and other options for treatment, the patient has consented to  Procedure(s): CESAREAN SECTION (N/A) as a surgical intervention.  The patient's history has been reviewed, patient examined, no change in status, stable for surgery.  I have reviewed the patient's chart and labs.  Questions were answered to the patient's satisfaction.     Deziah Renwick A Jashayla Glatfelter

## 2022-04-13 NOTE — Lactation Note (Signed)
This note was copied from a baby's chart. Lactation Consultation Note  Patient Name: Marilyn Cline [id number] Date: 04/13/2022 Reason for consult: Initial assessment;Primapara;1st time breastfeeding;Term;Breastfeeding assistance Age:29 hours  P1, Term, Infant Female Infant at 3 hours.  Per the birth parent baby latched well after birth, but her "suck is strong".  LC spoke with the birth parent about the difference between pinching and tugging.  LC also spoke with birth parent about infant behavior on day 1 of life.  Birth parent falling asleep.  LC encouraged parents to call when birth parent has had some rest to check the latch and work on hand expression.   Current Feeding Plan:  Breastfeed 8+ times in 24 hours according to feeding cues.  Call RN/LC for assistance with latch.  Maternal Data Does the patient have breastfeeding experience prior to this delivery?: No  Feeding Mother's Current Feeding Choice: Breast Milk  LATCH Score Latch: Repeated attempts needed to sustain latch, nipple held in mouth throughout feeding, stimulation needed to elicit sucking reflex.  Audible Swallowing: None  Type of Nipple: Everted at rest and after stimulation  Comfort (Breast/Nipple): Soft / non-tender  Hold (Positioning): Full assist, staff holds infant at breast  LATCH Score: 5   Lactation Tools Discussed/Used    Interventions Interventions: Breast feeding basics reviewed;Education;LC Services brochure  Discharge Pump: DEBP;Personal  Consult Status Consult Status: Follow-up Date: 04/13/22 Follow-up type: In-patient    Ysela Hettinger P Treylen Gibbs 04/13/2022, 11:00 AM

## 2022-04-13 NOTE — Progress Notes (Signed)
Dr. Stephannie Peters Anesthesia at bedside consenting patient at this time.

## 2022-04-13 NOTE — Anesthesia Procedure Notes (Signed)
Spinal  Patient location during procedure: OR Start time: 04/13/2022 7:30 AM End time: 04/13/2022 7:33 AM Reason for block: surgical anesthesia Staffing Performed: anesthesiologist  Anesthesiologist: Kaylyn Layer, MD Performed by: Kaylyn Layer, MD Authorized by: Kaylyn Layer, MD   Preanesthetic Checklist Completed: patient identified, IV checked, risks and benefits discussed, monitors and equipment checked, pre-op evaluation and timeout performed Spinal Block Patient position: sitting Prep: DuraPrep and site prepped and draped Patient monitoring: heart rate, continuous pulse ox and blood pressure Approach: midline Location: L3-4 Injection technique: single-shot Needle Needle type: Pencan  Needle gauge: 24 G Needle length: 10 cm Assessment Sensory level: T4 Events: CSF return Additional Notes Risks, benefits, and alternative discussed. Patient gave consent to procedure. Prepped and draped in sitting position. Clear CSF obtained after one needle pass. Positive terminal aspiration. No pain or paraesthesias with injection. Patient tolerated procedure well. Vital signs stable. Amalia Greenhouse, MD

## 2022-04-13 NOTE — Anesthesia Postprocedure Evaluation (Signed)
Anesthesia Post Note  Patient: Marilyn Cline  Procedure(s) Performed: CESAREAN SECTION     Patient location during evaluation: PACU Anesthesia Type: Spinal Level of consciousness: awake and alert Pain management: pain level controlled Vital Signs Assessment: post-procedure vital signs reviewed and stable Respiratory status: spontaneous breathing, nonlabored ventilation and respiratory function stable Cardiovascular status: blood pressure returned to baseline Postop Assessment: no apparent nausea or vomiting, spinal receding, no headache and no backache Anesthetic complications: no   No notable events documented.  Last Vitals:  Vitals:   04/13/22 0945 04/13/22 1000  BP: 104/68 98/82  Pulse: (!) 47 (!) 44  Resp: 14 16  Temp:  36.4 C  SpO2: 99% 100%    Last Pain:  Vitals:   04/13/22 0930  TempSrc:   PainSc: 0-No pain   Pain Goal:    LLE Motor Response: Purposeful movement (04/13/22 0930) LLE Sensation: Numbness (04/13/22 0930) RLE Motor Response: Purposeful movement (04/13/22 0930) RLE Sensation: Numbness (04/13/22 0930)     Epidural/Spinal Function Cutaneous sensation: Able to Discern Pressure (04/13/22 0930), Patient able to flex knees: No (04/13/22 0930), Patient able to lift hips off bed: No (04/13/22 0930), Back pain beyond tenderness at insertion site: No (04/13/22 0930), Progressively worsening motor and/or sensory loss: No (04/13/22 0930), Bowel and/or bladder incontinence post epidural: No (04/13/22 0930)  Shanda Howells

## 2022-04-13 NOTE — Transfer of Care (Signed)
Immediate Anesthesia Transfer of Care Note  Patient: Marilyn Cline  Procedure(s) Performed: CESAREAN SECTION  Patient Location: PACU  Anesthesia Type:Spinal  Level of Consciousness: awake, alert  and oriented  Airway & Oxygen Therapy: Patient Spontanous Breathing  Post-op Assessment: Report given to RN and Post -op Vital signs reviewed and stable  Post vital signs: Reviewed and stable  Last Vitals:  Vitals Value Taken Time  BP 105/67 04/13/22 0900  Temp    Pulse 53 04/13/22 0901  Resp 13 04/13/22 0901  SpO2 100 % 04/13/22 0901  Vitals shown include unvalidated device data.  Last Pain:  Vitals:   04/13/22 0618  TempSrc: Oral  PainSc: 0-No pain         Complications: No notable events documented.

## 2022-04-13 NOTE — Op Note (Signed)
NAME: Marilyn Cline, Marilyn Cline MEDICAL RECORD NO: [id number] ACCOUNT NO: [id number] DATE OF BIRTH: October 31, 1990 FACILITY: [facility] LOCATION: [id number] PHYSICIAN: Farida Mcreynolds A. Cherly Hensen, MD  Operative Report   DATE OF PROCEDURE: 04/13/2022  PREOPERATIVE DIAGNOSIS:  Breech presentation term gestation.  PROCEDURE:  Primary cesarean section, Kerr hysterotomy.  POSTOPERATIVE DIAGNOSES:  Incomplete breech presentation term gestation.  ANESTHESIA:  Spinal.  SURGEON:  Jeanean Hollett A. Cherly Hensen, MD.  ASSISTANT:  None.  DESCRIPTION OF PROCEDURE:  Under adequate spinal anesthesia, the patient was placed in the supine position with a left lateral tilt.  She was sterilely prepped and draped in the usual fashion.  Indwelling Foley catheter was sterilely placed.  0.25%  Marcaine was injected along the planned Pfannenstiel skin incision site.  Pfannenstiel skin incision was then made, carried down to the rectus fascia.  Rectus fascia opened transversely.  Rectus fascia was then bluntly and sharply dissected off the  rectus muscle in a superior and inferior fashion.  Rectus muscle was split in the midline.  The parietal peritoneum was entered sharply and extended.  A self-retaining Alexis retractor was then placed. Vesicouterine peritoneum was opened transversely and  the bladder was sharply dissected off the lower uterine segment and displaced inferiorly.  A curvilinear low transverse small incision was then made and extended bluntly in a cephalic and caudad manner.  Through the amniotic sac the limb was identified,  which subsequently had rupture of membranes.  One foot was up, one was down. The baby was delivered using the usual breech maneuver, with delivery occurring baby peed and pooped as it was coming out, delayed cord clamp x1 minute was done.  Baby was bulb  suctioned in the abdomen.  Cord was clamped and cut and the baby was transferred to the awaiting pediatricians who assigned Apgars of 9 and 9 at 1 and 5 minutes.   Placenta was manually removed.  It appeared irregular and  bilobed or possible succenturiate extra lobe and it was sent to  pathology.  Uterine cavity was manually and visually cleaned of debris.  No intracavitary abnormality was noted.  The uterus was exteriorized.  No abnormality of the uterus was noted.  Normal tubes and ovaries noted at that time. The uterus was returned  back to the abdomen.  The uterine incision was then closed in two layers, the first layer with 0 Monocryl running locked stitch, second layer was imbricating using 0 Monocryl suture.  The abdomen was then irrigated and suctioned of debris.  Interceed was  placed in the lower uterine segment in an inverted T fashion.  Alexis retractor was then removed.  The parietal peritoneum was closed with 2-0 Vicryl.  The rectus fascia was closed with 0 Vicryl x2.  The subcutaneous area was irrigated, small bleeders  cauterized.  Interrupted 2-0 plain sutures placed and the skin approximated using 4-0 Vicryl subcuticular closure.  Steri-Strips and benzoin was placed.  SPECIMEN:  Placenta sent to pathology.  ESTIMATED BLOOD LOSS:  1120 mL  URINE OUTPUT:  300 mL  INTRAOPERATIVE FLUIDS:  1800 mL  COUNTS:  Sponge and instrument counts x2 was correct.  COMPLICATIONS:  None.  The patient tolerated the procedure well and was transferred to recovery in stable condition.     [email or web address] D: 04/13/2022 9:00:23 am T: 04/13/2022 9:49:00 am  JOB: [id number]/ [id number]

## 2022-04-13 NOTE — H&P (Signed)
Marilyn Cline is a 31 y.o. female presenting@ 39 wk for Primary Cesarean section due to persistent breech presentation. OB History     Gravida  1   Para      Term      Preterm      AB      Living         SAB      IAB      Ectopic      Multiple      Live Births             Past Medical History:  Diagnosis Date   Arteriovenous malformation (AVM)    R arm   Endometriosis    Fatigue    Goiter    Hyperlipidemia    Vitamin D deficiency    Past Surgical History:  Procedure Laterality Date   TONSILLECTOMY     WISDOM TOOTH EXTRACTION     Family History: family history includes Diabetes in her maternal grandfather and maternal grandmother; Hypertension in her father, maternal grandfather, maternal grandmother, and mother. Social History:  reports that she has never smoked. She has never been exposed to tobacco smoke. She has never used smokeless tobacco. She reports that she does not drink alcohol and does not use drugs.     Maternal Diabetes: No Genetic Screening: Normal Maternal Ultrasounds/Referrals: Normal Fetal Ultrasounds or other Referrals:  None Maternal Substance Abuse:  No Significant Maternal Medications:  None Significant Maternal Lab Results:  Group B Strep negative Number of Prenatal Visits:greater than 3 verified prenatal visits Other Comments:  None  Review of Systems  All other systems reviewed and are negative.  Maternal Medical History:  Fetal activity: Perceived fetal activity is normal.   Prenatal Complications - Diabetes: none.     Last menstrual period 06/16/2021. Maternal Exam:  Introitus: Normal vulva.   Physical Exam Constitutional:      Appearance: Normal appearance.  HENT:     Head: Atraumatic.  Eyes:     Extraocular Movements: Extraocular movements intact.  Cardiovascular:     Rate and Rhythm: Regular rhythm.     Heart sounds: Normal heart sounds.  Pulmonary:     Breath sounds: Normal breath sounds.  Abdominal:      Palpations: Abdomen is soft.  Genitourinary:    General: Normal vulva.  Musculoskeletal:     Cervical back: Neck supple.  Skin:    General: Skin is warm and dry.  Neurological:     General: No focal deficit present.     Mental Status: She is alert and oriented to person, place, and time.  Psychiatric:        Mood and Affect: Mood normal.        Behavior: Behavior normal.     Prenatal labs: ABO, Rh: --/--/O POS (09/01 9417) Antibody: NEG (09/01 0907) Rubella: Immune (02/16 0000) RPR: NON REACTIVE (09/01 0909)  HBsAg: Negative (02/16 0000)  HIV: Non-reactive (02/16 0000)  GBS: Negative/-- (08/09 0000)   Assessment/Plan: Breech presentation Term gestation P) Primary C/S. Procedure explained. Risk of surgery reviewed including infection, bleeding, injury to bladder bowel, ureter,internal scar tissue, possible need for blood transfusion and its risk( HIV, acute rxn, hepatitis). All ? answered   Sherree Shankman A Kenetra Hildenbrand 04/13/2022, 6:14 AM

## 2022-04-14 LAB — CBC
HCT: 26.6 % — ABNORMAL LOW (ref 36.0–46.0)
Hemoglobin: 8.8 g/dL — ABNORMAL LOW (ref 12.0–15.0)
MCH: 28.7 pg (ref 26.0–34.0)
MCHC: 33.1 g/dL (ref 30.0–36.0)
MCV: 86.6 fL (ref 80.0–100.0)
Platelets: 220 10*3/uL (ref 150–400)
RBC: 3.07 MIL/uL — ABNORMAL LOW (ref 3.87–5.11)
RDW: 16.3 % — ABNORMAL HIGH (ref 11.5–15.5)
WBC: 13.2 10*3/uL — ABNORMAL HIGH (ref 4.0–10.5)
nRBC: 0 % (ref 0.0–0.2)

## 2022-04-14 MED ORDER — SODIUM CHLORIDE 0.9 % IV SOLN
500.0000 mg | Freq: Once | INTRAVENOUS | Status: AC
  Administered 2022-04-14: 500 mg via INTRAVENOUS
  Filled 2022-04-14: qty 500

## 2022-04-14 NOTE — Lactation Note (Addendum)
This note was copied from a baby's chart. Lactation Consultation Note  Patient Name: Marilyn Cline PNTIR'W Date: 04/14/2022 Reason for consult: Follow-up assessment;Primapara;Term;1st time breastfeeding Age:29 hours   P1: Term infant at 39+0 weeks Feeding preference: Breast  "Burnetta Sabin" was STS on support person's chest when I arrived.  Parents had many questions which I answered to their satisfaction.   Reviewed breast feeding basics.  Taught hand expression; few drops noted.  Birth parent asking about nipple balm and I encouraged colostrum drops first and then an application of her own nipple balm.  Discussed the possibility of cluster feeding tonight. Birth parent latched approximately one hour ago.  Offered to return as needed for latch assistance.  Paternal and maternal grandmothers arrived at the end of my visit.  Family appreciative of advice given.   Maternal Data Has patient been taught Hand Expression?: Yes Does the patient have breastfeeding experience prior to this delivery?: No  Feeding Mother's Current Feeding Choice: Breast Milk  LATCH Score                    Lactation Tools Discussed/Used    Interventions Interventions: Breast feeding basics reviewed;Skin to skin;Breast massage;Hand express;Education  Discharge    Consult Status Consult Status: Follow-up Date: 04/15/22 Follow-up type: In-patient    Crissa Sowder R Ayannah Faddis 04/14/2022, 11:12 AM

## 2022-04-14 NOTE — Lactation Note (Signed)
This note was copied from a baby's chart. Lactation Consultation Note  Patient Name: Marilyn Cline GEXBM'W Date: 04/14/2022 Reason for consult: Follow-up assessment;Mother's request;Difficult latch;1st time breastfeeding;Term;Infant weight loss (C/S delivery, -4% weight loss, infant been poor feeder not sustaing latch.) Age:29 hours Current feeding plan is: Breast and supplementing with donor breast milk. Birth Parent made multiple attempts to latch infant infant would not sustain latch. Infant has been using Pacifier through out the day and LC observed infant has short and thick lingual frenulum with limited tongue mobility that tethers to the bottom of tongue tip. LC ask Birth Parent to have Pediatrician, MD to assess the oral cavity of infant's mouth.  Birth Parent fitted with 20 mm NS, infant sustained latch on left breast using football hold, BF for 12 minutes. Afterwards infant was supplemented with 10 mls of donor breast milk using a curve tip syringe on LC's gloved finger. Birth Parent knows breast milk is safe at room temp. For 4 hours whereas donor breast milk must be used within 1 hour. Birth Parent was using DEBP as LC left the room. Birth Parent Plan: 1- Latch infant according to hunger cues, 8 to 12+ times within 24 hours, STS using 20 mm NS, Birth Parent knows to ask RN for assistance with applying 20 mm NS if needed. 2- Birth Parent will continue to use DEBP every 3 hours for 15 minutes on initial setting and give infant back any EBM 1st before supplementing infant with donor breast milk. 3- Birth Parent has been given the BF supplemental guideline sheet based on infant's age/ hours of life. Maternal Data    Feeding Mother's Current Feeding Choice: Breast Milk and Donor Milk  LATCH Score Latch: Grasps breast easily, tongue down, lips flanged, rhythmical sucking.  Audible Swallowing: A few with stimulation  Type of Nipple: Everted at rest and after stimulation (Birth  Parent nipples are everted outward more today, Birth Parent has been wearing breast shells and areola edema is resolving.)  Comfort (Breast/Nipple): Soft / non-tender  Hold (Positioning): Assistance needed to correctly position infant at breast and maintain latch.  LATCH Score: 8   Lactation Tools Discussed/Used Tools: Shells;Pump Breast pump type: Double-Electric Breast Pump Pump Education: Setup, frequency, and cleaning;Milk Storage Reason for Pumping: Infant has not been sustaining latch, poor feeder, been having pacifier Pumping frequency: Birth Parent will used DEBP every 3 hours for 15 minutes on inital setting.  Interventions Interventions: Assisted with latch;Skin to skin;Breast compression;Adjust position;Support pillows;Position options;Expressed milk;DEBP;Education  Discharge    Consult Status Consult Status: Follow-up Date: 04/15/22 Follow-up type: In-patient    Danelle Earthly 04/14/2022, 8:31 PM

## 2022-04-14 NOTE — Progress Notes (Signed)
SVD: primary  S:  Pt reports feeling well/ Tolerating po/ Voiding without problems/ No n/v/ Bleeding is light/ Pain controlled withacetaminophen and prescription NSAID's including ibuprofen (Motrin)    O:  A & O x 3 / VS: Blood pressure 103/68, pulse (!) 54, temperature 98 F (36.7 C), temperature source Oral, resp. rate 16, height 5\' 10"  (1.778 m), weight 100.2 kg, last menstrual period 06/16/2021, SpO2 100 %, unknown if currently breastfeeding.  LABS:  Results for orders placed or performed during the hospital encounter of 04/13/22 (from the past 24 hour(s))  CBC     Status: Abnormal   Collection Time: 04/14/22  4:41 AM  Result Value Ref Range   WBC 13.2 (H) 4.0 - 10.5 K/uL   RBC 3.07 (L) 3.87 - 5.11 MIL/uL   Hemoglobin 8.8 (L) 12.0 - 15.0 g/dL   HCT 06/14/22 (L) 65.4 - 65.0 %   MCV 86.6 80.0 - 100.0 fL   MCH 28.7 26.0 - 34.0 pg   MCHC 33.1 30.0 - 36.0 g/dL   RDW 35.4 (H) 65.6 - 81.2 %   Platelets 220 150 - 400 K/uL   nRBC 0.0 0.0 - 0.2 %    I&O: I/O last 3 completed shifts: In: 2509.7 [P.O.:1200; I.V.:1100; IV Piggyback:209.7] Out: 3824 [Urine:2700; Blood:1124]   No intake/output data recorded.  Lungs: chest clear, no wheezing, rales, normal symmetric air entry  Heart: regular rate and rhythm, S1, S2 normal, no murmur, click, rub or gallop  Abdomen: soft non distended, uterus firm at umb primary dressing d/c/i  Perineum: not inspected  Lochia: light  Extremities:no redness or tenderness in the calves or thighs, no edema    A/P: POD # 1/PPD # 1/ G1P1001  Doing well  Continue routine post partum orders  Continue inderal. Mgmt per Dr 04-27-1998

## 2022-04-15 LAB — SURGICAL PATHOLOGY

## 2022-04-15 MED ORDER — IBUPROFEN 600 MG PO TABS: 600.0000 mg | ORAL_TABLET | Freq: Four times a day (QID) | ORAL | 11 refills | Status: DC | PRN

## 2022-04-15 MED ORDER — OXYCODONE HCL 5 MG PO TABS: 5.0000 mg | ORAL_TABLET | ORAL | 0 refills | Status: AC | PRN

## 2022-04-15 NOTE — Lactation Note (Signed)
This note was copied from a baby's chart. Lactation Consultation Note  Patient Name: Marilyn Cline Date: 04/15/2022 Reason for consult: Follow-up assessment;Term;Primapara;1st time breastfeeding Age:31 years   P1: Term infant at 39+0 weeks Feeding preference: Breast/formula (Supplementation until milk comes to volume)  "Marilyn Cline" asleep in support person's arms when I arrived.  Reviewed current feeding plan with parents.  Birth parent has been using a nipple shield and supplementing with formula until her milk comes to volume. Previous LC noted a lingual frenulum (not observed by me due to baby sleeping at this time).  Encouraged to continue latching with every feeding and to follow with supplementation of 30+ mls/feeding as baby desires.  Birth parent stated no discomfort while using the nipple shield.  Suggested she continue to post pump after every feeding to help ensure a good milk supply.  Birth parent verbalized understanding and has been able to obtain a few drops of colostrum with pumping.  Also suggested continued breast massage and hand expression before/after pumping.  Family would like a discharge today.  Discussed following up with op LC as needed due to possible frenulum issue.  Provided information sheet regarding appointment and how to contact the office for a consult.  Pediatric follow up is in 2 days.   Maternal Data Has patient been taught Hand Expression?: Yes Does the patient have breastfeeding experience prior to this delivery?: No  Feeding Mother's Current Feeding Choice: Breast Milk and Formula Nipple Type: Slow - flow  LATCH Score                    Lactation Tools Discussed/Used Tools: Pump;Flanges Flange Size: 24;27 Breast pump type: Double-Electric Breast Pump;Manual Pump Education: Setup, frequency, and cleaning (No review needed) Reason for Pumping: Breast stimulation for supplementation Pumping frequency: Every three  hours  Interventions Interventions: Breast feeding basics reviewed;Hand express;Education  Discharge Pump: Personal;Manual  Consult Status Consult Status: Complete Date: 04/15/22 Follow-up type: Call as needed    Marilyn Cline Marilyn Cline Marilyn Cline 04/15/2022, 8:52 AM

## 2022-04-15 NOTE — Discharge Summary (Signed)
Postpartum Discharge Summary  Date of Service updated     Patient Name: Marilyn Cline DOB: 14-Jun-1991 MRN: [id number]  Date of admission: 04/13/2022 Delivery date:04/13/2022  Delivering provider: Lenoir Facchini  Date of discharge: 04/15/2022  Admitting diagnosis: breech presentation, term gestation Intrauterine pregnancy: [redacted]w[redacted]d    Secondary diagnosis:  Principal Problem:   Postpartum care following cesarean delivery  Additional problems: IDA, palpitation    Discharge diagnosis: Term Pregnancy Delivered, Anemia, and palpitation                                               Post partum procedures: IV iron infusion Augmentation: N/A Complications: None  Hospital course: Sceduled C/S   31y.o. yo G1P1001 at 337w0das admitted to the hospital 04/13/2022 for scheduled cesarean section with the following indication: breech .Delivery details are as follows:  Membrane Rupture Time/Date: 7:56 AM ,04/13/2022   Delivery Method:C-Section, Low Transverse  Details of operation can be found in separate operative note.  Patient had an uncomplicated postpartum course. IV iron infusion for anemia  She is ambulating, tolerating a regular diet, passing flatus, and urinating well. Patient is discharged home in stable condition on  04/15/2022        Newborn Data: Birth date:04/13/2022  Birth time:7:57 AM  Gender:Female  Living status:Living  Apgars:9 ,9  Weight:3.36 kg     Magnesium Sulfate received: No BMZ received: No Rhophylac:No MMR:No T-DaP:Given prenatally Flu: No Transfusion:No  Physical exam  Vitals:   04/14/22 0330 04/14/22 0508 04/14/22 2158 04/15/22 0503  BP: (!) 105/58 103/68 119/80 122/77  Pulse: (!) 56 (!) 54 61 (!) 57  Resp: '17 16 18 16  ' Temp: 98 F (36.7 C) 98 F (36.7 C) 98.1 F (36.7 C) 98.6 F (37 C)  TempSrc: Oral Oral Oral   SpO2:   100% 98%  Weight:      Height:       General: alert, cooperative, and no distress Lochia: appropriate Uterine Fundus:  firm Incision: Dressing is clean, dry, and intact DVT Evaluation: No evidence of DVT seen on physical exam. No significant calf/ankle edema. Labs: Lab Results  Component Value Date   WBC 13.2 (H) 04/14/2022   HGB 8.8 (L) 04/14/2022   HCT 26.6 (L) 04/14/2022   MCV 86.6 04/14/2022   PLT 220 04/14/2022      Latest Ref Rng & Units 02/23/2014    5:00 PM  CMP  Glucose 70 - 99 mg/dL 87   BUN 6 - 23 mg/dL 10   Creatinine 0.50 - 1.10 mg/dL 0.78   Sodium 137 - 147 mEq/L 139   Potassium 3.7 - 5.3 mEq/L 4.2   Chloride 96 - 112 mEq/L 101   CO2 19 - 32 mEq/L 26   Calcium 8.4 - 10.5 mg/dL 9.4   Total Protein 6.0 - 8.3 g/dL 7.9   Total Bilirubin 0.3 - 1.2 mg/dL 1.5   Alkaline Phos 39 - 117 U/L 42   AST 0 - 37 U/L 16   ALT 0 - 35 U/L 9    Edinburgh Score:    04/15/2022    5:03 AM  Edinburgh Postnatal Depression Scale Screening Tool  I have been able to laugh and see the funny side of things. 0  I have looked forward with enjoyment to things. 0  I have blamed myself unnecessarily  when things went wrong. 1  I have been anxious or worried for no good reason. 0  I have felt scared or panicky for no good reason. 0  Things have been getting on top of me. 0  I have been so unhappy that I have had difficulty sleeping. 0  I have felt sad or miserable. 0  I have been so unhappy that I have been crying. 0  The thought of harming myself has occurred to me. 0  Edinburgh Postnatal Depression Scale Total 1      After visit meds:  Allergies as of 04/15/2022       Reactions   Amoxicillin Rash   Penicillin G Rash   Sulfa Antibiotics Rash   Penicillins Cross Reactors Rash   Sulfa Drugs Cross Reactors Rash        Medication List     TAKE these medications    ascorbic acid 500 MG tablet Commonly known as: VITAMIN C Take 500 mg by mouth every evening.   calcium carbonate 500 MG chewable tablet Commonly known as: TUMS - dosed in mg elemental calcium Chew 1-2 tablets by mouth 3 (three)  times daily as needed for indigestion or heartburn.   esomeprazole 20 MG capsule Commonly known as: NEXIUM Take 20 mg by mouth daily as needed (indigestion/heartburn.).   ibuprofen 600 MG tablet Commonly known as: ADVIL Take 1 tablet (600 mg total) by mouth every 6 (six) hours as needed.   iron polysaccharides 150 MG capsule Commonly known as: NIFEREX Take 150 mg by mouth every evening.   oxyCODONE 5 MG immediate release tablet Commonly known as: Oxy IR/ROXICODONE Take 1 tablet (5 mg total) by mouth every 4 (four) hours as needed for up to 7 days for moderate pain.   PRENATAL VITAMIN PO Take 1 tablet by mouth every evening.   propranolol 10 MG tablet Commonly known as: INDERAL Take 1 tablet (10 mg total) by mouth at bedtime.         Discharge home in stable condition Infant Feeding: Bottle and Breast Infant Disposition:home with mother Discharge instruction: per After Visit Summary and Postpartum booklet. Activity: Advance as tolerated. Pelvic rest for 6 weeks.  Diet: iron rich diet Anticipated Birth Control: POPs Postpartum Appointment:6 weeks Additional Postpartum F/U:  n/a Future Appointments: Future Appointments  Date Time Provider [place]  06/16/2022 10:40 AM Tobb, Godfrey Pick, DO [facility] None   Follow up Visit:  Follow-up Information     Servando Salina, MD Follow up in 6 week(s).   Specialty: Obstetrics and Gynecology Contact information: [address] [place] [address] [phone]                     04/15/2022 Marvene Staff, MD

## 2022-04-15 NOTE — Progress Notes (Signed)
SVD: primary  S:  Pt reports feeling well/ Tolerating po/ Voiding without problems/ No n/v/ Bleeding is light/ Pain controlled withacetaminophen and prescription NSAID's including ibuprofen (Motrin)    O:  A & O x 3 / VS: Blood pressure 122/77, pulse (!) 57, temperature 98.6 F (37 C), resp. rate 16, height 5\' 10"  (1.778 m), weight 100.2 kg, last menstrual period 06/16/2021, SpO2 98 %, unknown if currently breastfeeding.  LABS: No results found for this or any previous visit (from the past 24 hour(s)).  I&O: I/O last 3 completed shifts: In: -  Out: 1950 [Urine:1950]   No intake/output data recorded.  Lungs: chest clear, no wheezing, rales, normal symmetric air entry  Heart: regular rate and rhythm, S1, S2 normal, no murmur, click, rub or gallop  Abdomen: uterus firm at umb primary dressing d/c/i  Perineum: not inspected  Lochia: light  Extremities:no redness or tenderness in the calves or thighs, no edema    A/P: POD # 2/PPD # 2/ G1P1001 S/p c/S IDA s/p IV iron infusion   Doing well  Continue routine post partum orders  D/c instructions reviewed Cont iron supplement F/u 6 wk  Script sent inderal

## 2022-04-15 NOTE — Discharge Instructions (Signed)
Call if temperature greater than equal to 100.4, nothing per vagina for 4-6 weeks or severe nausea vomiting, increased incisional pain , drainage or redness in the incision site, no straining with bowel movements, showers no bath °

## 2022-04-21 NOTE — Progress Notes (Signed)
I was called by Dr. Cherly Hensen for the patients concerns regarding back pain. I contacted Marilyn Cline who refers to me that she is experiencing midline back pain, non radiating and has no fever, rubor or calor over the site. Just midline pain on movement.  I reassured the patient that this continues post epidural in about 10% of patients.  Baring  any change in symptoms it would resolve and if not I would be available to see her.  She seemed reassured.  [email or web address]

## 2022-04-23 ENCOUNTER — Telehealth (HOSPITAL_COMMUNITY): Payer: Self-pay | Admitting: *Deleted

## 2022-04-23 NOTE — Telephone Encounter (Signed)
Hospital Discharge Follow-Up Call: ? ?Patient reports that she is well and has no concerns about her healing process.  EPDS today was 4 and she endorses this accurately reflects that she is doing well emotionally. ? ?Patient says that baby is well and she has no concerns about baby's health.  She reports that baby sleeps in a bassinet.  Reviewed ABCs of Safe Sleep. ?

## 2022-06-09 ENCOUNTER — Ambulatory Visit (INDEPENDENT_AMBULATORY_CARE_PROVIDER_SITE_OTHER): Payer: 59 | Admitting: Podiatry

## 2022-06-09 DIAGNOSIS — M2041 Other hammer toe(s) (acquired), right foot: Secondary | ICD-10-CM

## 2022-06-16 ENCOUNTER — Encounter: Payer: Self-pay | Admitting: Cardiology

## 2022-06-16 ENCOUNTER — Ambulatory Visit: Payer: 59 | Attending: Cardiology | Admitting: Cardiology

## 2022-06-16 DIAGNOSIS — I471 Supraventricular tachycardia, unspecified: Secondary | ICD-10-CM | POA: Diagnosis not present

## 2022-06-16 NOTE — Patient Instructions (Signed)
Medication Instructions:  Your physician has recommended you make the following change in your medication:  Taper off of the propanolol as directed by Dr. Harriet Masson, should be completely off of the propanolol by the ending of the month.  *If you need a refill on your cardiac medications before your next appointment, please call your pharmacy*   Lab Work: NONE If you have labs (blood work) drawn today and your tests are completely normal, you will receive your results only by: [place] (if you have MyChart) OR A paper copy in the mail If you have any lab test that is abnormal or we need to change your treatment, we will call you to review the results.   Testing/Procedures: NONE   Follow-Up: At [facility], you and your health needs are our priority.  As part of our continuing mission to provide you with exceptional heart care, we have created designated Provider Care Teams.  These Care Teams include your primary Cardiologist (physician) and Advanced Practice Providers (APPs -  Physician Assistants and Nurse Practitioners) who all work together to provide you with the care you need, when you need it.  We recommend signing up for the patient portal called "MyChart".  Sign up information is provided on this After Visit Summary.  MyChart is used to connect with patients for Virtual Visits (Telemedicine).  Patients are able to view lab/test results, encounter notes, upcoming appointments, etc.  Non-urgent messages can be sent to your provider as well.   To learn more about what you can do with MyChart, go to [email or web address].    Your next appointment:   As Needed   The format for your next appointment:   In Person  Provider:   Berniece Salines, DO

## 2022-06-16 NOTE — Progress Notes (Signed)
Marilyn Cline  Follow Up Note   Date:  06/16/2022   ID:  Marilyn Cline, DOB [birth year], MRN [id number]  PCP:  Richrd Humbles, MD   [facility] Cardiologist:  Berniece Salines, DO  Electrophysiologist:  None       Patient is at home. I am in the office.  Virtual Visit via Video  Note . I connected with the patient today by a   video enabled telemedicine application and verified that I am speaking with the correct person using two identifiers.    Referring MD: Richrd Humbles, MD   Chief Complaint: " I am doing well"  History of Present Illness:    Marilyn Cline is a 31 y.o. female [id number] who returns for follow up of cardiovascular care in pregnancy.  Her medical history includes Hyperlipidemia, Hypothyroidism, Vitamin D Deficiency.  She was initially seen October 24, 2021 at that time she was experiencing significant palpitations shortness of breath.  Her shortness of breath was clinically out of proportion therefore an echocardiogram was done.  She did get the echocardiogram that shows normal.  Her ZIO monitor showed evidence of rare paroxysmal SVT.  At her visit on Dec 25, 2021 she still was experiencing symptoms I started the patient on propranolol.    This is a patient March 20, 2022 at that time she was doing well on the propanolol.  Since her visit she has delivered a beautiful baby girl.  She is doing well.  She offers no complaints at this time.  Prior CV Studies Reviewed: The following studies were reviewed today:  ZIO monitor Patch Wear Time:  20 days and 16 hours (2023-03-17T16:42:26-0400 to 2023-04-10T08:31:46-0400)   Monitor 1 Patient had a min HR of 55 bpm, max HR of 157 bpm, and avg HR of 83 bpm. Predominant underlying rhythm was Sinus Rhythm. Isolated SVEs were rare (<1.0%), SVE Triplets were rare (<1.0%), and no SVE Couplets were present. Isolated VEs were rare (<1.0%),  and no VE Couplets or VE Triplets were present.    Monitor  2 Patient had a min HR of 52 bpm, max HR of 166 bpm, and avg HR of 80 bpm. Predominant underlying rhythm was Sinus Rhythm. Slight P wave morphology changes were noted. 1 run of Supraventricular Tachycardia occurred lasting 5 beats with a max rate of 158  bpm (avg 152 bpm). Supraventricular Tachycardia was detected within +/- 45 seconds of symptomatic patient event(s). Isolated SVEs were rare (<1.0%), SVE Couplets were rare (<1.0%), and SVE Triplets were rare (<1.0%). Isolated VEs were rare (<1.0%), and  no VE Couplets or VE Triplets were present. 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     11/03/2021 IMPRESSIONS   1. Left ventricular ejection fraction, by estimation, is 55 to 60%. Left  ventricular ejection fraction by 3D volume is 60 %. The left ventricle has  normal function. The left ventricle has no regional wall motion  abnormalities. Left ventricular diastolic   parameters were normal. The average left ventricular global longitudinal  strain is -27.1 %. The global longitudinal strain is normal.   2. Right ventricular systolic function is normal. The right ventricular  size is normal.   3. The mitral valve is normal in structure. Trivial mitral valve  regurgitation.   4. The aortic valve is tricuspid. Aortic valve regurgitation is not  visualized. No aortic stenosis is present.   5. The inferior vena cava is normal in size with greater than 50%  respiratory variability,  suggesting right atrial pressure of 3 mmHg.   Comparison(s): No prior Echocardiogram.   FINDINGS   Left Ventricle: Left ventricular ejection fraction, by estimation, is 55  to 60%. Left ventricular ejection fraction by 3D volume is 60 %. The left  ventricle has normal function. The left ventricle has no regional wall  motion abnormalities. The average  left ventricular global longitudinal strain is -27.1 %. The global  longitudinal strain is normal. The left ventricular  internal cavity size  was normal in size. There is no left ventricular hypertrophy. Left  ventricular diastolic parameters were normal.   Right Ventricle: The right ventricular size is normal. No increase in  right ventricular wall thickness. Right ventricular systolic function is  normal.   Left Atrium: Left atrial size was normal in size.   Right Atrium: Right atrial size was normal in size.   Pericardium: There is no evidence of pericardial effusion.   Mitral Valve: The mitral valve is normal in structure. Trivial mitral  valve regurgitation.   Tricuspid Valve: The tricuspid valve is normal in structure. Tricuspid  valve regurgitation is trivial.   Aortic Valve: The aortic valve is tricuspid. Aortic valve regurgitation is  not visualized. No aortic stenosis is present.   Pulmonic Valve: The pulmonic valve was normal in structure. Pulmonic valve  regurgitation is not visualized.   Aorta: The aortic root and ascending aorta are structurally normal, with  no evidence of dilitation.   Venous: The inferior vena cava is normal in size with greater than 50%  respiratory variability, suggesting right atrial pressure of 3 mmHg.   IAS/Shunts: The atrial septum is grossly normal.   Past Medical History:  Diagnosis Date   Arteriovenous malformation (AVM)    R arm   Endometriosis    Fatigue    Goiter    Hyperlipidemia    Vitamin D deficiency     Past Surgical History:  Procedure Laterality Date   CESAREAN SECTION N/A 04/13/2022   Procedure: CESAREAN SECTION;  Surgeon: Servando Salina, MD;  Location: [facility];  Service: Obstetrics;  Laterality: N/A;   TONSILLECTOMY     WISDOM TOOTH EXTRACTION        OB History     Gravida  1   Para  1   Term  1   Preterm      AB      Living  1      SAB      IAB      Ectopic      Multiple  0   Live Births  1               Current Medications: Current Meds  Medication Sig   ibuprofen (ADVIL) 600 MG tablet  Take 1 tablet (600 mg total) by mouth every 6 (six) hours as needed.   iron polysaccharides (NIFEREX) 150 MG capsule Take 150 mg by mouth every evening.   Prenatal Vit-Fe Fumarate-FA (PRENATAL VITAMIN PO) Take 1 tablet by mouth every evening.   propranolol (INDERAL) 10 MG tablet Take 1 tablet (10 mg total) by mouth at bedtime.   ZAFEMY 150-35 MCG/24HR transdermal patch 1 patch once a week.     Allergies:   Amoxicillin, Penicillin g, Sulfa antibiotics, Penicillins cross reactors, and Sulfa drugs cross reactors   Social History   Socioeconomic History   Marital status: Married    Spouse name: Not on file   Number of children: Not on file   Years of education:  Not on file   Highest education level: Not on file  Occupational History   Not on file  Tobacco Use   Smoking status: Never    Passive exposure: Never   Smokeless tobacco: Never  Vaping Use   Vaping Use: Never used  Substance and Sexual Activity   Alcohol use: No   Drug use: No   Sexual activity: Yes    Birth control/protection: None  Other Topics Concern   Not on file  Social History Narrative   Not on file   Social Determinants of Health   Financial Resource Strain: Not on file  Food Insecurity: No Food Insecurity (10/26/2021)   Hunger Vital Sign    Worried About Running Out of Food in the Last Year: Never true    Ran Out of Food in the Last Year: Never true  Transportation Needs: No Transportation Needs (10/26/2021)   PRAPARE - Hydrologist (Medical): No    Lack of Transportation (Non-Medical): No  Physical Activity: Not on file  Stress: Not on file  Social Connections: Not on file      Family History  Problem Relation Age of Onset   Diabetes Maternal Grandmother    Hypertension Maternal Grandmother    Diabetes Maternal Grandfather    Hypertension Maternal Grandfather    Hypertension Mother    Hypertension Father       ROS:   Please see the history of present illness.      All other systems reviewed and are negative.   Labs/EKG Reviewed:    EKG:   EKG is was ordered today.   Recent Labs: 04/14/2022: Hemoglobin 8.8; Platelets 220   Recent Lipid Panel No results found for: "CHOL", "TRIG", "HDL", "CHOLHDL", "LDLCALC", "LDLDIRECT"  Physical Exam:    VS:  LMP 06/16/2021   Breastfeeding No     Wt Readings from Last 3 Encounters:  04/13/22 220 lb 14.4 oz (100.2 kg)  04/02/22 223 lb (101.2 kg)  03/20/22 218 lb 3.2 oz (99 kg)     GEN:  Well nourished, well developed in no acute distress HEENT: Normal NECK: No JVD; No carotid bruits LYMPHATICS: No lymphadenopathy CARDIAC: RRR, no murmurs, rubs, gallops RESPIRATORY:  Clear to auscultation without rales, wheezing or rhonchi  ABDOMEN: Soft, non-tender, non-distended MUSCULOSKELETAL:  No edema; No deformity  SKIN: Warm and dry NEUROLOGIC:  Alert and oriented x 3 PSYCHIATRIC:  Normal affect    Risk Assessment/Risk Calculators:                  ASSESSMENT & PLAN:    Paroxysmal SVT  We will transition the patient off of the propanolol and see how she does.  Her lows were not that many some hoping that we can take her off of the propanolol.  I have educated the patient on how to titrate this off.  If she gets symptomatic off of the propanolol we will then get her back on a low-dose beta-blocker  The patient is in agreement with the above plan.  We will see her as needed.  Patient Instructions  Medication Instructions:  Your physician has recommended you make the following change in your medication:  Taper off of the propanolol as directed by Dr. Harriet Masson, should be completely off of the propanolol by the ending of the month.  *If you need a refill on your cardiac medications before your next appointment, please call your pharmacy*   Lab Work: NONE If you have labs (blood  work) drawn today and your tests are completely normal, you will receive your results only by: [place] (if you  have MyChart) OR A paper copy in the mail If you have any lab test that is abnormal or we need to change your treatment, we will call you to review the results.   Testing/Procedures: NONE   Follow-Up: At [facility], you and your health needs are our priority.  As part of our continuing mission to provide you with exceptional heart care, we have created designated Provider Care Teams.  These Care Teams include your primary Cardiologist (physician) and Advanced Practice Providers (APPs -  Physician Assistants and Nurse Practitioners) who all work together to provide you with the care you need, when you need it.  We recommend signing up for the patient portal called "MyChart".  Sign up information is provided on this After Visit Summary.  MyChart is used to connect with patients for Virtual Visits (Telemedicine).  Patients are able to view lab/test results, encounter notes, upcoming appointments, etc.  Non-urgent messages can be sent to your provider as well.   To learn more about what you can do with MyChart, go to [email or web address].    Your next appointment:   As Needed   The format for your next appointment:   In Person  Provider:   Berniece Salines, DO     Dispo:  Return if symptoms worsen or fail to improve.   Medication Adjustments/Labs and Tests Ordered: Current medicines are reviewed at length with the patient today.  Concerns regarding medicines are outlined above.  Tests Ordered: No orders of the defined types were placed in this encounter.  Medication Changes: No orders of the defined types were placed in this encounter.

## 2022-06-17 NOTE — Progress Notes (Signed)
  Subjective:  Patient ID: Marilyn Cline, female    DOB: [birth year],  MRN: [id number]  Chief Complaint  Patient presents with   Foot Pain    31 y.o. female presents with the above complaint.  Patient presents with right fifth digit hammertoe.  Patient states that she is having some issues with her toe.  She wanted to get it evaluated she has not seen anyone else prior to seeing me for this.  She has had contusion in the past.  She has not tried all conservative care but wanted to discuss options.  Pain scale is mild to moderate in nature.   Review of Systems: Negative except as noted in the HPI. Denies N/V/F/Ch.  Past Medical History:  Diagnosis Date   Arteriovenous malformation (AVM)    R arm   Endometriosis    Fatigue    Goiter    Hyperlipidemia    Vitamin D deficiency     Current Outpatient Medications:    ibuprofen (ADVIL) 600 MG tablet, Take 1 tablet (600 mg total) by mouth every 6 (six) hours as needed., Disp: 30 tablet, Rfl: 11   iron polysaccharides (NIFEREX) 150 MG capsule, Take 150 mg by mouth every evening., Disp: , Rfl:    Prenatal Vit-Fe Fumarate-FA (PRENATAL VITAMIN PO), Take 1 tablet by mouth every evening., Disp: , Rfl:    propranolol (INDERAL) 10 MG tablet, Take 1 tablet (10 mg total) by mouth at bedtime., Disp: 90 tablet, Rfl: 1   ZAFEMY 150-35 MCG/24HR transdermal patch, 1 patch once a week., Disp: , Rfl:   Social History   Tobacco Use  Smoking Status Never   Passive exposure: Never  Smokeless Tobacco Never    Allergies  Allergen Reactions   Amoxicillin Rash   Penicillin G Rash   Sulfa Antibiotics Rash   Penicillins Cross Reactors Rash   Sulfa Drugs Cross Reactors Rash   Objective:  There were no vitals filed for this visit. There is no height or weight on file to calculate BMI. Constitutional Well developed. Well nourished.  Vascular Dorsalis pedis pulses palpable bilaterally. Posterior tibial pulses palpable bilaterally. Capillary refill  normal to all digits.  No cyanosis or clubbing noted. Pedal hair growth normal.  Neurologic Normal speech. Oriented to person, place, and time. Epicritic sensation to light touch grossly present bilaterally.  Dermatologic Nails well groomed and normal in appearance. No open wounds. No skin lesions.  Orthopedic: Pain on palpation of right fifth digit fifth digit hammertoe contracture noted semiflexible in nature.  Some adductovarus deformity noted to the toe.   Radiographs: None Assessment:   1. Hammertoe of right foot    Plan:  Patient was evaluated and treated and all questions answered.  Right fifth digit hammertoe contracture of -All questions and concerns were discussed with the patient in extensive detail ultimately I discussed with her shoe gear modification and conservative care in extensive detail if there is no improvement we will discuss surgical options at that time.  She states understanding.  No follow-ups on file.

## 2022-08-31 ENCOUNTER — Ambulatory Visit (INDEPENDENT_AMBULATORY_CARE_PROVIDER_SITE_OTHER): Payer: 59

## 2022-08-31 ENCOUNTER — Ambulatory Visit (INDEPENDENT_AMBULATORY_CARE_PROVIDER_SITE_OTHER): Payer: 59 | Admitting: Podiatry

## 2022-08-31 DIAGNOSIS — M2042 Other hammer toe(s) (acquired), left foot: Secondary | ICD-10-CM

## 2022-08-31 DIAGNOSIS — M2041 Other hammer toe(s) (acquired), right foot: Secondary | ICD-10-CM

## 2022-08-31 NOTE — Progress Notes (Signed)
   Chief Complaint  Patient presents with   Hammer Toe    Right foot hammertoe 5th toe,     HPI: 32 y.o. female presenting today for new complaint of pain and tenderness associated to the bilateral fifth toes.  Patient states that a few years ago she did fracture her right fifth digit.  She says that both of her fifth toes are very painful and tender.  They rub in all of her shoes.  She has tried different shoe gear modifications with no relief or improvement.  She presents for further treatment and evaluation  Past Medical History:  Diagnosis Date   Arteriovenous malformation (AVM)    R arm   Endometriosis    Fatigue    Goiter    Hyperlipidemia    Vitamin D deficiency       Objective: Physical Exam General: The patient is alert and oriented x3 in no acute distress.  Dermatology: Skin is cool, dry and supple bilateral lower extremities. Negative for open lesions or macerations.  Vascular: Palpable pedal pulses bilaterally. No edema or erythema noted. Capillary refill within normal limits.  Neurological: Epicritic and protective threshold grossly intact bilaterally.   Musculoskeletal Exam: All pedal and ankle joints range of motion within normal limits bilateral. Muscle strength 5/5 in all groups bilateral. Hammertoe contracture deformity noted to digits #5 of the bilateral foot.  Radiographic Exam B/L feet 08/31/2022: Adductovarus hammertoe contracture deformity noted specifically to the fifth digits bilateral.  Normal osseous mineralization.  Degenerative changes noted at the PIPJ of the right foot fifth digit as well likely consistent with prior history of fracture   Assessment/plan of care: 1.  Symptomatic adductovarus hammertoe fifth digit bilateral -Patient evaluated.  X-rays reviewed -Unfortunately patient has had pain and tenderness associated to the fifth digits for several years now despite different shoe gear modifications with no improvement or relief.  I do believe  that a derotational arthroplasty of the fifth digits bilateral is warranted.  We discussed the surgery in detail.  Risk benefits advantages and disadvantages also explained.  All patient questions answered.  No guarantees were expressed or implied.  Postoperative recovery course explained.  WBAT postsurgical shoes bilateral for about 3-4 weeks and then slowly transition back into tennis shoes.  Patient understands -Postsurgical shoe was dispensed today -Authorization for surgery initiated today.  Surgery will consist of PIPJ derotational arthroplasty fifth digit bilateral -Return to clinic 1 week postop    Edrick Kins, DPM [facility]  Dr. Edrick Kins, DPM    [address], [place] [address]                Office [phone]  Fax [phone]

## 2022-09-07 ENCOUNTER — Other Ambulatory Visit: Payer: Self-pay | Admitting: Podiatry

## 2022-09-07 DIAGNOSIS — M2041 Other hammer toe(s) (acquired), right foot: Secondary | ICD-10-CM

## 2022-09-07 DIAGNOSIS — M2042 Other hammer toe(s) (acquired), left foot: Secondary | ICD-10-CM

## 2022-12-10 ENCOUNTER — Telehealth: Payer: Self-pay | Admitting: Urology

## 2022-12-10 NOTE — Telephone Encounter (Signed)
DOS - 01/07/23  HAMMERTOE REPAIR 5TH BILAT --- [address]  [employer] EFFECTIVE DATE - 07/10/22  DEDUCTIBLE - This member's individual deductible will be satisfied when their family deductible has been met. OOP - $6,000.00 W/ $5,331.47 REMAINING COINSURANCE - 30%  PER [employer] WEBSITE FOR CPT CODE 60454 HAS BEEN APPROVED, AUTH # [id number], GOOD FROM 01/07/23 - 04/07/23.

## 2023-01-07 ENCOUNTER — Other Ambulatory Visit: Payer: Self-pay | Admitting: Podiatry

## 2023-01-07 DIAGNOSIS — M2042 Other hammer toe(s) (acquired), left foot: Secondary | ICD-10-CM | POA: Diagnosis not present

## 2023-01-07 DIAGNOSIS — M2041 Other hammer toe(s) (acquired), right foot: Secondary | ICD-10-CM | POA: Diagnosis not present

## 2023-01-07 MED ORDER — OXYCODONE-ACETAMINOPHEN 5-325 MG PO TABS: 1.0000 | ORAL_TABLET | ORAL | 0 refills | Status: DC | PRN

## 2023-01-07 MED ORDER — IBUPROFEN 800 MG PO TABS: 800.0000 mg | ORAL_TABLET | Freq: Three times a day (TID) | ORAL | 1 refills | Status: DC

## 2023-01-07 NOTE — Progress Notes (Signed)
PRN postop 

## 2023-01-13 ENCOUNTER — Ambulatory Visit (INDEPENDENT_AMBULATORY_CARE_PROVIDER_SITE_OTHER): Payer: 59

## 2023-01-13 ENCOUNTER — Ambulatory Visit (INDEPENDENT_AMBULATORY_CARE_PROVIDER_SITE_OTHER): Payer: 59 | Admitting: Podiatry

## 2023-01-13 ENCOUNTER — Encounter: Payer: Self-pay | Admitting: Podiatry

## 2023-01-13 DIAGNOSIS — Z9889 Other specified postprocedural states: Secondary | ICD-10-CM

## 2023-01-13 NOTE — Progress Notes (Signed)
   Chief Complaint  Patient presents with   Routine Post Op    POV # 1 DOS 01/07/23 --- HAMMERTOE ARTHROPLASTY 5TH DIGIT BILAT    Subjective:  Patient presents today status post fifth toe arthroplasty bilateral.  DOS: 01/07/2023.  Patient doing well.  WBAT surgical shoes as instructed.  Dressings are clean dry and intact  Past Medical History:  Diagnosis Date   Arteriovenous malformation (AVM)    R arm   Endometriosis    Fatigue    Goiter    Hyperlipidemia    Vitamin D deficiency     Past Surgical History:  Procedure Laterality Date   CESAREAN SECTION N/A 04/13/2022   Procedure: CESAREAN SECTION;  Surgeon: Maxie Better, MD;  Location: [facility] LD [facility]S;  Service: Obstetrics;  Laterality: N/A;   TONSILLECTOMY     WISDOM TOOTH EXTRACTION      Allergies  Allergen Reactions   Amoxicillin Rash   Penicillin G Rash   Sulfa Antibiotics Rash   Penicillins Cross Reactors Rash   Sulfa Drugs Cross Reactors Rash    Objective/Physical Exam Neurovascular status intact.  Incision well coapted with sutures intact. No sign of infectious process noted. No dehiscence. No active bleeding noted.  No edema.  Toes are in good rectus alignment. Radiographic Exam B/L feet 01/13/2023:  Interval resection of the head of the proximal phalanx bilateral fifth toes  Assessment: 1. s/p fifth toe arthroplasty bilateral. DOS: 01/13/2023   Plan of Care:  -Patient was evaluated. X-rays reviewed -Dressings changed.  Patient may begin washing and showering and getting the foot wet.  Recommend triple antibiotic and a Band-Aid daily to the incision sites -Continue WBAT surgical shoes -Return to clinic 1 week suture removal   Felecia Shelling, DPM [facility]  Dr. Felecia Shelling, DPM    [address] [place], [place] [address]                Office [phone]  Fax [phone]

## 2023-01-20 ENCOUNTER — Ambulatory Visit (INDEPENDENT_AMBULATORY_CARE_PROVIDER_SITE_OTHER): Payer: 59 | Admitting: Podiatry

## 2023-01-20 DIAGNOSIS — Z9889 Other specified postprocedural states: Secondary | ICD-10-CM

## 2023-01-20 NOTE — Progress Notes (Signed)
   Chief Complaint  Patient presents with   Routine Post Op    POV # 2 DOS 01/07/23 --- HAMMERTOE ARTHROPLASTY 5TH DIGIT BILATERAL, patient denies any pain, NO N/V/F/C/SOB,  Sutures removed today     Subjective:  Patient presents today status post fifth toe arthroplasty bilateral.  DOS: 01/07/2023.  Patient continues to do well.  WBAT surgical shoes.  No new complaints  Past Medical History:  Diagnosis Date   Arteriovenous malformation (AVM)    R arm   Endometriosis    Fatigue    Goiter    Hyperlipidemia    Vitamin D deficiency     Past Surgical History:  Procedure Laterality Date   CESAREAN SECTION N/A 04/13/2022   Procedure: CESAREAN SECTION;  Surgeon: Maxie Better, MD;  Location: [facility];  Service: Obstetrics;  Laterality: N/A;   TONSILLECTOMY     WISDOM TOOTH EXTRACTION      Allergies  Allergen Reactions   Amoxicillin Rash   Penicillin G Rash   Sulfa Antibiotics Rash   Penicillins Cross Reactors Rash   Sulfa Drugs Cross Reactors Rash    Objective/Physical Exam Neurovascular status intact.  Incision well coapted with sutures intact. No sign of infectious process noted. No dehiscence. No active bleeding noted.  No edema.  Toes are in good rectus alignment. Radiographic Exam B/L feet 01/13/2023:  Interval resection of the head of the proximal phalanx bilateral fifth toes  Assessment: 1. s/p fifth toe arthroplasty bilateral. DOS: 01/13/2023   Plan of Care:  -Patient was evaluated.  -Sutures removed -Patient may discontinue postop shoe.  Recommend good supportive tennis shoes that do not irritate or constrict the fifth digits or the toebox.  Patient has wide new balance shoes. -Slowly increase activity over the following 4 weeks -Return to clinic 1 month follow-up x-ray   Felecia Shelling, DPM [facility]  Dr. Felecia Shelling, DPM    [address] [place], [place] [address]                Office [phone]   Fax [phone]

## 2023-02-03 ENCOUNTER — Encounter: Payer: 59 | Admitting: Podiatry

## 2023-02-22 ENCOUNTER — Ambulatory Visit (INDEPENDENT_AMBULATORY_CARE_PROVIDER_SITE_OTHER): Payer: 59 | Admitting: Podiatry

## 2023-02-22 ENCOUNTER — Encounter: Payer: Self-pay | Admitting: Podiatry

## 2023-02-22 DIAGNOSIS — Z9889 Other specified postprocedural states: Secondary | ICD-10-CM

## 2023-02-22 NOTE — Progress Notes (Signed)
   Chief Complaint  Patient presents with   Hammer Toe    Routine post op, POV # 3 DOS 01/07/23 ---, patient denies any pain, NO N/V/F/C/SOB,  Sutures removed today    Subjective:  Patient presents today status post fifth toe arthroplasty bilateral.  DOS: 01/07/2023.  Patient has essentially returned to full activity in good shoes.  She states that she only has some minimal tenderness to the right toe.  Left toe is completely asymptomatic  Past Medical History:  Diagnosis Date   Arteriovenous malformation (AVM)    R arm   Endometriosis    Fatigue    Goiter    Hyperlipidemia    Vitamin D deficiency     Past Surgical History:  Procedure Laterality Date   CESAREAN SECTION N/A 04/13/2022   Procedure: CESAREAN SECTION;  Surgeon: Maxie Better, MD;  Location: [facility];  Service: Obstetrics;  Laterality: N/A;   TONSILLECTOMY     WISDOM TOOTH EXTRACTION      Allergies  Allergen Reactions   Amoxicillin Rash   Penicillin G Rash   Sulfa Antibiotics Rash   Penicillins Cross Reactors Rash   Sulfa Drugs Cross Reactors Rash    Objective/Physical Exam Neurovascular status intact.  Incisions nicely healed.  Toes in good rectus alignment.  There is some minimal tenderness to the right fifth toe with palpation and range of motion  Radiographic Exam B/L feet 01/13/2023:  Interval resection of the head of the proximal phalanx bilateral fifth toes  Assessment: 1. s/p fifth toe arthroplasty bilateral. DOS: 01/13/2023   Plan of Care:  -Patient was evaluated.  - Patient may resume full activity no restrictions -Recommend good supportive tennis shoes and sneakers -Return to clinic as needed  Felecia Shelling, DPM [facility]  Dr. Felecia Shelling, DPM    [address] [place], [place] [address]                Office [phone]  Fax [phone]

## 2023-06-30 ENCOUNTER — Encounter: Payer: Self-pay | Admitting: Cardiology

## 2023-07-21 ENCOUNTER — Ambulatory Visit: Payer: BC Managed Care – PPO | Attending: Cardiology | Admitting: Cardiology

## 2023-07-21 ENCOUNTER — Ambulatory Visit: Payer: BC Managed Care – PPO | Attending: Cardiology

## 2023-07-21 ENCOUNTER — Encounter: Payer: Self-pay | Admitting: Cardiology

## 2023-07-21 VITALS — BP 104/78 | HR 56 | Ht 70.0 in | Wt 182.8 lb

## 2023-07-21 DIAGNOSIS — R002 Palpitations: Secondary | ICD-10-CM

## 2023-07-21 DIAGNOSIS — R079 Chest pain, unspecified: Secondary | ICD-10-CM

## 2023-07-21 DIAGNOSIS — I10 Essential (primary) hypertension: Secondary | ICD-10-CM

## 2023-07-21 DIAGNOSIS — I471 Supraventricular tachycardia, unspecified: Secondary | ICD-10-CM | POA: Diagnosis not present

## 2023-07-21 DIAGNOSIS — E782 Mixed hyperlipidemia: Secondary | ICD-10-CM

## 2023-07-21 MED ORDER — PROPRANOLOL HCL 10 MG PO TABS: 10.0000 mg | ORAL_TABLET | Freq: Three times a day (TID) | ORAL | 3 refills | Status: DC | PRN

## 2023-07-21 NOTE — Progress Notes (Signed)
Cardiology Office Note:    Date:  07/21/2023   ID:  Marilyn Cline, DOB 20-Feb-1991, MRN [id number]  PCP:  Durenda Hurt, MD  Cardiologist:  Thomasene Ripple, DO  Electrophysiologist:  None   Referring MD: Durenda Hurt, MD   " I had chest pain"   History of Present Illness:    Marilyn Cline is a 32 y.o. female with a hx of Hyperlipidemia, Hypothyroidism, Vitamin D Deficiency  and PSVT on her monitor during pregnancy.  Two weeks ago, she experienced an episode of chest pain accompanied by sweating and a racing heart, which made her feel like she was going to pass out. 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 The patient reports ongoing stressors in her life. She has not had any episodes of passing out. The patient was previously on a heart monitor about a year and a half ago.   Past Medical History:  Diagnosis Date   Arteriovenous malformation (AVM)    R arm   Endometriosis    Fatigue    Goiter    Hyperlipidemia    Vitamin D deficiency     Past Surgical History:  Procedure Laterality Date   CESAREAN SECTION N/A 04/13/2022   Procedure: CESAREAN SECTION;  Surgeon: Maxie Better, MD;  Location: [facility];  Service: Obstetrics;  Laterality: N/A;   TONSILLECTOMY     WISDOM TOOTH EXTRACTION      Current Medications: Current Meds  Medication Sig   propranolol (INDERAL) 10 MG tablet Take 1 tablet (10 mg total) by mouth every 8 (eight) hours as needed (for heart rate greater than 130 bpm or palpations).   ZAFEMY 150-35 MCG/24HR transdermal patch 1 patch once a week.     Allergies:   Amoxicillin, Penicillin g, Sulfa antibiotics, Penicillins cross reactors, and Sulfa drugs cross reactors   Social History   Socioeconomic History   Marital status: Married    Spouse name: Not on file   Number of children: Not on file   Years of education: Not on file   Highest education level: Not on file  Occupational History   Not on file  Tobacco Use    Smoking status: Never    Passive exposure: Never   Smokeless tobacco: Never  Vaping Use   Vaping status: Never Used  Substance and Sexual Activity   Alcohol use: No   Drug use: No   Sexual activity: Yes    Birth control/protection: None  Other Topics Concern   Not on file  Social History Narrative   Not on file   Social Determinants of Health   Financial Resource Strain: Not on file  Food Insecurity: No Food Insecurity (10/26/2021)   Hunger Vital Sign    Worried About Running Out of Food in the Last Year: Never true    Ran Out of Food in the Last Year: Never true  Transportation Needs: No Transportation Needs (10/26/2021)   PRAPARE - Administrator, Civil Service (Medical): No    Lack of Transportation (Non-Medical): No  Physical Activity: Not on file  Stress: Not on file  Social Connections: Not on file     Family History: The patient's family history includes Diabetes in her maternal grandfather and maternal grandmother; Hypertension in her father, maternal grandfather, maternal grandmother, and mother.  ROS:   Review of Systems  Constitution: Negative for decreased appetite, fever and weight gain.  HENT: Negative for congestion, ear discharge, hoarse voice and sore  throat.   Eyes: Negative for discharge, redness, vision loss in right eye and visual halos.  Cardiovascular: Negative for chest pain, dyspnea on exertion, leg swelling, orthopnea and palpitations.  Respiratory: Negative for cough, hemoptysis, shortness of breath and snoring.   Endocrine: Negative for heat intolerance and polyphagia.  Hematologic/Lymphatic: Negative for bleeding problem. Does not bruise/bleed easily.  Skin: Negative for flushing, nail changes, rash and suspicious lesions.  Musculoskeletal: Negative for arthritis, joint pain, muscle cramps, myalgias, neck pain and stiffness.  Gastrointestinal: Negative for abdominal pain, bowel incontinence, diarrhea and excessive appetite.   Genitourinary: Negative for decreased libido, genital sores and incomplete emptying.  Neurological: Negative for brief paralysis, focal weakness, headaches and loss of balance.  Psychiatric/Behavioral: Negative for altered mental status, depression and suicidal ideas.  Allergic/Immunologic: Negative for HIV exposure and persistent infections.    EKGs/Labs/Other Studies Reviewed:    The following studies were reviewed today:   EKG:  The ekg ordered today demonstrates   Recent Labs: No results found for requested labs within last 365 days.  Recent Lipid Panel No results found for: "CHOL", "TRIG", "HDL", "CHOLHDL", "VLDL", "LDLCALC", "LDLDIRECT"  Physical Exam:    VS:  BP 104/78 (BP Location: Right Arm, Patient Position: Sitting, Cuff Size: Normal)   Pulse (!) 56   Ht 5\' 10"  (1.778 m)   Wt 182 lb 12.8 oz (82.9 kg)   SpO2 98%   BMI 26.23 kg/m     Wt Readings from Last 3 Encounters:  07/21/23 182 lb 12.8 oz (82.9 kg)  04/13/22 220 lb 14.4 oz (100.2 kg)  04/02/22 223 lb (101.2 kg)     GEN: Well nourished, well developed in no acute distress HEENT: Normal NECK: No JVD; No carotid bruits LYMPHATICS: No lymphadenopathy CARDIAC: S1S2 noted,RRR, no murmurs, rubs, gallops RESPIRATORY:  Clear to auscultation without rales, wheezing or rhonchi  ABDOMEN: Soft, non-tender, non-distended, +bowel sounds, no guarding. EXTREMITIES: No edema, No cyanosis, no clubbing MUSCULOSKELETAL:  No deformity  SKIN: Warm and dry NEUROLOGIC:  Alert and oriented x 3, non-focal PSYCHIATRIC:  Normal affect, good insight  ASSESSMENT:    1. PSVT (paroxysmal supraventricular tachycardia) (HCC)   2. Chest pain of uncertain etiology   3. Palpitations   4. Mixed hyperlipidemia   5. Primary hypertension    PLAN:    PSVT Recent episode of palpitations with sweating and near syncope. No current medication for acute management. Prior cardiac monitoring 1.5 years ago. -Order 14-day cardiac monitor to  reassess rhythm during symptomatic episodes. -Prescribe Propranolol for as-needed use during episodes of palpitations. -Check thyroid function tests to rule out hyperthyroidism as a potential cause of palpitations. -Schedule virtual follow-up in 16 weeks to review cardiac monitor results and reassess symptoms.   The patient is in agreement with the above plan. The patient left the office in stable condition.  The patient will follow up in   Medication Adjustments/Labs and Tests Ordered: Current medicines are reviewed at length with the patient today.  Concerns regarding medicines are outlined above.  Orders Placed This Encounter  Procedures   LONG TERM MONITOR (3-14 DAYS)   EKG 12-Lead   Meds ordered this encounter  Medications   propranolol (INDERAL) 10 MG tablet    Sig: Take 1 tablet (10 mg total) by mouth every 8 (eight) hours as needed (for heart rate greater than 130 bpm or palpations).    Dispense:  45 tablet    Refill:  3    Patient Instructions  Medication Instructions:  Your  physician has recommended you make the following change in your medication:  CHANGE: Propranolol 10 mg every 8 hours for heart rate greater than 130 bpm or palpations  *If you need a refill on your cardiac medications before your next appointment, please call your pharmacy*   Testing/Procedures: ZIO XT- Long Term Monitor Instructions  Your physician has requested you wear a ZIO patch monitor for 14 days.  This is a single patch monitor. [employer] supplies one patch monitor per enrollment. Additional stickers are not available. Please do not apply patch if you will be having a Nuclear Stress Test,  Echocardiogram, Cardiac CT, MRI, or Chest Xray during the period you would be wearing the  monitor. The patch cannot be worn during these tests. You cannot remove and re-apply the  ZIO XT patch monitor.  Your ZIO patch monitor will be mailed 3 day USPS to your address on file. It may take 3-5 days  to  receive your monitor after you have been enrolled.  Once you have received your monitor, please review the enclosed instructions. Your monitor  has already been registered assigning a specific monitor serial # to you.  Billing and Patient Assistance Program Information  We have supplied [employer] with any of your insurance information on file for billing purposes. [employer] offers a sliding scale Patient Assistance Program for patients that do not have  insurance, or whose insurance does not completely cover the cost of the ZIO monitor.  You must apply for the Patient Assistance Program to qualify for this discounted rate.  To apply, please call [employer] at [phone], select option 4, select option 2, ask to apply for  Patient Assistance Program. Meredeth Ide will ask your household income, and how many people  are in your household. They will quote your out-of-pocket cost based on that information.  [employer] will also be able to set up a 42-month, interest-free payment plan if needed.  Applying the monitor   Shave hair from upper left chest.  Hold abrader disc by orange tab. Rub abrader in 40 strokes over the upper left chest as  indicated in your monitor instructions.  Clean area with 4 enclosed alcohol pads. Let dry.  Apply patch as indicated in monitor instructions. Patch will be placed under collarbone on left  side of chest with arrow pointing upward.  Rub patch adhesive wings for 2 minutes. Remove white label marked "1". Remove the white  label marked "2". Rub patch adhesive wings for 2 additional minutes.  While looking in a mirror, press and release button in center of patch. A small green light will  flash 3-4 times. This will be your only indicator that the monitor has been turned on.  Do not shower for the first 24 hours. You may shower after the first 24 hours.  Press the button if you feel a symptom. You will hear a small click. Record Date, Time and  Symptom in the Patient  Logbook.  When you are ready to remove the patch, follow instructions on the last 2 pages of Patient  Logbook. Stick patch monitor onto the last page of Patient Logbook.  Place Patient Logbook in the blue and white box. Use locking tab on box and tape box closed  securely. The blue and white box has prepaid postage on it. Please place it in the mailbox as  soon as possible. Your physician should have your test results approximately 7 days after the  monitor has been mailed back to [facility].  Call [employer] Customer Care at [phone] if you have questions regarding  your ZIO XT patch monitor. Call them immediately if you see an orange light blinking on your  monitor.  If your monitor falls off in less than 4 days, contact our Monitor department at [phone].  If your monitor becomes loose or falls off after 4 days call [employer] at [phone] for  suggestions on securing your monitor    Follow-Up: At [facility], you and your health needs are our priority.  As part of our continuing mission to provide you with exceptional heart care, we have created designated Provider Care Teams.  These Care Teams include your primary Cardiologist (physician) and Advanced Practice Providers (APPs -  Physician Assistants and Nurse Practitioners) who all work together to provide you with the care you need, when you need it.   Your next appointment:   16 week(s) via MyChart  Provider:   Thomasene Ripple, DO     Other Instructions You are invited to attend: Women's Heart Community event on February Friday 7th 2025 at [facility] ([address] [place], [place], [place] [address]) from 8am-12pm. Feel free to invite other women to attend!  See you there!      Adopting a Healthy Lifestyle.  Know what a healthy weight is for you (roughly BMI <25) and aim to maintain this   Aim for 7+ servings of fruits and vegetables daily   65-80+ fluid ounces of water or unsweet tea for healthy  kidneys   Limit to max 1 drink of alcohol per day; avoid smoking/tobacco   Limit animal fats in diet for cholesterol and heart health - choose grass fed whenever available   Avoid highly processed foods, and foods high in saturated/trans fats   Aim for low stress - take time to unwind and care for your mental health   Aim for 150 min of moderate intensity exercise weekly for heart health, and weights twice weekly for bone health   Aim for 7-9 hours of sleep daily   When it comes to diets, agreement about the perfect plan isnt easy to find, even among the experts. Experts at the [facility] [employer] developed an idea known as the Healthy Eating Plate. Just imagine a plate divided into logical, healthy portions.   The emphasis is on diet quality:   Load up on vegetables and fruits - one-half of your plate: Aim for color and variety, and remember that potatoes dont count.   Go for whole grains - one-quarter of your plate: Whole wheat, barley, wheat berries, quinoa, oats, [place] rice, and foods made with them. If you want pasta, go with whole wheat pasta.   Protein power - one-quarter of your plate: Fish, chicken, beans, and nuts are all healthy, versatile protein sources. Limit red meat.   The diet, however, does go beyond the plate, offering a few other suggestions.   Use healthy plant oils, such as olive, canola, soy, corn, sunflower and peanut. Check the labels, and avoid partially hydrogenated oil, which have unhealthy trans fats.   If youre thirsty, drink water. Coffee and tea are good in moderation, but skip sugary drinks and limit milk and dairy products to one or two daily servings.   The type of carbohydrate in the diet is more important than the amount. Some sources of carbohydrates, such as vegetables, fruits, whole grains, and beans-are healthier than others.   Finally, stay active  Signed, Thomasene Ripple, DO  07/21/2023 2:43 PM  [facility]  HeartCare

## 2023-07-21 NOTE — Patient Instructions (Signed)
Medication Instructions:  Your physician has recommended you make the following change in your medication:  CHANGE: Propranolol 10 mg every 8 hours for heart rate greater than 130 bpm or palpations  *If you need a refill on your cardiac medications before your next appointment, please call your pharmacy*   Testing/Procedures: ZIO XT- Long Term Monitor Instructions  Your physician has requested you wear a ZIO patch monitor for 14 days.  This is a single patch monitor. [employer] supplies one patch monitor per enrollment. Additional stickers are not available. Please do not apply patch if you will be having a Nuclear Stress Test,  Echocardiogram, Cardiac CT, MRI, or Chest Xray during the period you would be wearing the  monitor. The patch cannot be worn during these tests. You cannot remove and re-apply the  ZIO XT patch monitor.  Your ZIO patch monitor will be mailed 3 day USPS to your address on file. It may take 3-5 days  to receive your monitor after you have been enrolled.  Once you have received your monitor, please review the enclosed instructions. Your monitor  has already been registered assigning a specific monitor serial # to you.  Billing and Patient Assistance Program Information  We have supplied [employer] with any of your insurance information on file for billing purposes. [employer] offers a sliding scale Patient Assistance Program for patients that do not have  insurance, or whose insurance does not completely cover the cost of the ZIO monitor.  You must apply for the Patient Assistance Program to qualify for this discounted rate.  To apply, please call [employer] at [phone], select option 4, select option 2, ask to apply for  Patient Assistance Program. Meredeth Ide will ask your household income, and how many people  are in your household. They will quote your out-of-pocket cost based on that information.  [employer] will also be able to set up a 62-month, interest-free payment plan  if needed.  Applying the monitor   Shave hair from upper left chest.  Hold abrader disc by orange tab. Rub abrader in 40 strokes over the upper left chest as  indicated in your monitor instructions.  Clean area with 4 enclosed alcohol pads. Let dry.  Apply patch as indicated in monitor instructions. Patch will be placed under collarbone on left  side of chest with arrow pointing upward.  Rub patch adhesive wings for 2 minutes. Remove white label marked "1". Remove the white  label marked "2". Rub patch adhesive wings for 2 additional minutes.  While looking in a mirror, press and release button in center of patch. A small green light will  flash 3-4 times. This will be your only indicator that the monitor has been turned on.  Do not shower for the first 24 hours. You may shower after the first 24 hours.  Press the button if you feel a symptom. You will hear a small click. Record Date, Time and  Symptom in the Patient Logbook.  When you are ready to remove the patch, follow instructions on the last 2 pages of Patient  Logbook. Stick patch monitor onto the last page of Patient Logbook.  Place Patient Logbook in the blue and white box. Use locking tab on box and tape box closed  securely. The blue and white box has prepaid postage on it. Please place it in the mailbox as  soon as possible. Your physician should have your test results approximately 7 days after the  monitor has been mailed back to [facility].  Call [facility] Customer Care at [phone] if you have questions regarding  your ZIO XT patch monitor. Call them immediately if you see an orange light blinking on your  monitor.  If your monitor falls off in less than 4 days, contact our Monitor department at [phone].  If your monitor becomes loose or falls off after 4 days call [employer] at [phone] for  suggestions on securing your monitor    Follow-Up: At [facility], you and your health needs are  our priority.  As part of our continuing mission to provide you with exceptional heart care, we have created designated Provider Care Teams.  These Care Teams include your primary Cardiologist (physician) and Advanced Practice Providers (APPs -  Physician Assistants and Nurse Practitioners) who all work together to provide you with the care you need, when you need it.   Your next appointment:   16 week(s) via MyChart  Provider:   Thomasene Ripple, DO     Other Instructions You are invited to attend: Women's Heart Community event on February Friday 7th 2025 at [facility] ([address] [place], [place], [place] [address]) from 8am-12pm. Feel free to invite other women to attend!  See you there!

## 2023-07-21 NOTE — Progress Notes (Unsigned)
Enrolled for [employer] to mail a ZIO XT long term holter monitor to the patients address on file.  

## 2023-07-23 DIAGNOSIS — R002 Palpitations: Secondary | ICD-10-CM | POA: Diagnosis not present

## 2023-08-09 DIAGNOSIS — F5102 Adjustment insomnia: Secondary | ICD-10-CM | POA: Diagnosis not present

## 2023-09-05 ENCOUNTER — Encounter: Payer: Self-pay | Admitting: Cardiology

## 2023-10-11 ENCOUNTER — Other Ambulatory Visit: Payer: Self-pay | Admitting: Podiatry

## 2023-10-11 ENCOUNTER — Ambulatory Visit (INDEPENDENT_AMBULATORY_CARE_PROVIDER_SITE_OTHER)

## 2023-10-11 ENCOUNTER — Encounter: Payer: Self-pay | Admitting: Podiatry

## 2023-10-11 ENCOUNTER — Ambulatory Visit (INDEPENDENT_AMBULATORY_CARE_PROVIDER_SITE_OTHER): Payer: Self-pay | Admitting: Podiatry

## 2023-10-11 DIAGNOSIS — M7750 Other enthesopathy of unspecified foot: Secondary | ICD-10-CM

## 2023-10-11 DIAGNOSIS — M7751 Other enthesopathy of right foot: Secondary | ICD-10-CM

## 2023-10-11 NOTE — Progress Notes (Signed)
   Chief Complaint  Patient presents with   Foot Pain    5th met base right - aching x 3 weeks, no injury, dots on the skin, tried foot massager-no help    Subjective:  Patient presents today status post fifth toe arthroplasty bilateral.  DOS: 01/07/2023.  Patient has no pain or tenderness to the fifth toe arthroplasty site.  Patient states that about 3 weeks ago she began to develop pain and tenderness to the lateral aspect of the right foot.  She cannot recall a history of injury or eliciting factor.  There is no significant improvement over the last 3 weeks  Past Medical History:  Diagnosis Date   Arteriovenous malformation (AVM)    R arm   Endometriosis    Fatigue    Goiter    Hyperlipidemia    Vitamin D deficiency     Past Surgical History:  Procedure Laterality Date   CESAREAN SECTION N/A 04/13/2022   Procedure: CESAREAN SECTION;  Surgeon: Maxie Better, MD;  Location: [facility] LD ORS;  Service: Obstetrics;  Laterality: N/A;   TONSILLECTOMY     WISDOM TOOTH EXTRACTION      Allergies  Allergen Reactions   Amoxicillin Rash   Penicillin G Rash   Sulfa Antibiotics Rash   Penicillins Cross Reactors Rash   Sulfa Drugs Cross Reactors Rash    Objective/Physical Exam Neurovascular status intact.  No pain to the right fifth toe arthroplasty site.  There are some tenderness with palpation along the diaphysis of the fifth metatarsal as well as plantarly along the plantar lateral column of the foot.  Minimal pain however.  Radiographic Exam RT foot 10/11/2023:  Interval arthroplasty of the head of the proximal phalanx right fifth toe with good alignment of the toe.  Normal osseous mineralization.  No acute fractures identified.  Joint spaces preserved.  Impression: Negative  Assessment: 1. H/o fifth toe arthroplasty bilateral. DOS: 01/13/2023 2. Fifth metatarsal tubercle w/ insertional peroneal tendinitis right  Plan of Care:  -Patient was evaluated.  - Patient states that  over the last 3 weeks there has been significant improvement -Continue conservative measures.  RICE -Recommend good supportive tennis shoes and sneakers.  Advise against when barefoot -Return to clinic as needed  Felecia Shelling, DPM [facility]  Dr. Felecia Shelling, DPM    [address] [place], [place] [address]                Office [phone]  Fax [phone]

## 2023-11-01 ENCOUNTER — Ambulatory Visit: Payer: Self-pay | Admitting: Podiatry

## 2023-11-05 ENCOUNTER — Encounter: Payer: Self-pay | Admitting: Cardiology

## 2023-11-05 ENCOUNTER — Ambulatory Visit: Payer: BC Managed Care – PPO | Attending: Cardiology | Admitting: Cardiology

## 2023-11-05 VITALS — Ht 70.0 in | Wt 183.0 lb

## 2023-11-05 DIAGNOSIS — I471 Supraventricular tachycardia, unspecified: Secondary | ICD-10-CM

## 2023-11-05 MED ORDER — PROPRANOLOL HCL 10 MG PO TABS
10.0000 mg | ORAL_TABLET | Freq: Every day | ORAL | 3 refills | Status: AC
Start: 2023-11-05 — End: ?

## 2023-11-05 NOTE — Progress Notes (Addendum)
 Cardiology Office Note:    Date:  11/05/2023   ID:  Marilyn Cline, DOB 05-Jun-1991, MRN [id number]  PCP:  Andreas Bandy, MD  Cardiologist:  Jerrel Tiberio, DO  Electrophysiologist:  None   Referring MD: Andreas Bandy, MD    I am doing well  She is at home.  I am in the office.   Virtual Visit via Video  Note . I connected with the patient today by a   video enabled telemedicine application and verified that I am speaking with the correct person using two identifiers.   History of Present Illness:    Marilyn Cline is a 33 y.o. female with a hx of Hyperlipidemia, Hypothyroidism, Vitamin D Deficiency  and PSVT on her monitor during pregnancy.  Two weeks ago, she experienced an episode of chest pain accompanied by sweating and a racing heart, which made her feel like she was going to pass out. 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 The patient reports ongoing stressors in her life. She has not had any episodes of passing out. The patient was previously on a heart monitor about a year and a half ago.   Past Medical History:  Diagnosis Date   Arteriovenous malformation (AVM)    R arm   Endometriosis    Fatigue    Goiter    Hyperlipidemia    Vitamin D deficiency     Past Surgical History:  Procedure Laterality Date   CESAREAN SECTION N/A 04/13/2022   Procedure: CESAREAN SECTION;  Surgeon: Ivery Marking, MD;  Location: [facility];  Service: Obstetrics;  Laterality: N/A;   TONSILLECTOMY     WISDOM TOOTH EXTRACTION      Current Medications: Current Meds  Medication Sig   [DISCONTINUED] propranolol  (INDERAL ) 10 MG tablet Take 10 mg by mouth daily.     Allergies:   Amoxicillin, Penicillin g, Sulfa antibiotics, Penicillins cross reactors, and Sulfa drugs cross reactors   Social History   Socioeconomic History   Marital status: Married    Spouse name: Not on file   Number of children: Not on file   Years of education: Not on file    Highest education level: Not on file  Occupational History   Not on file  Tobacco Use   Smoking status: Never    Passive exposure: Never   Smokeless tobacco: Never  Vaping Use   Vaping status: Never Used  Substance and Sexual Activity   Alcohol use: No   Drug use: No   Sexual activity: Yes    Birth control/protection: None  Other Topics Concern   Not on file  Social History Narrative   Not on file   Social Drivers of Health   Financial Resource Strain: Not on file  Food Insecurity: No Food Insecurity (10/26/2021)   Hunger Vital Sign    Worried About Running Out of Food in the Last Year: Never true    Ran Out of Food in the Last Year: Never true  Transportation Needs: No Transportation Needs (10/26/2021)   PRAPARE - Administrator, Civil Service (Medical): No    Lack of Transportation (Non-Medical): No  Physical Activity: Not on file  Stress: Not on file  Social Connections: Not on file     Family History: The patient's family history includes Diabetes in her maternal grandfather and maternal grandmother; Hypertension in her father, maternal grandfather, maternal grandmother, and mother.  ROS:   Review of Systems  Constitution:  Negative for decreased appetite, fever and weight gain.  HENT: Negative for congestion, ear discharge, hoarse voice and sore throat.   Eyes: Negative for discharge, redness, vision loss in right eye and visual halos.  Cardiovascular: Negative for chest pain, dyspnea on exertion, leg swelling, orthopnea and palpitations.  Respiratory: Negative for cough, hemoptysis, shortness of breath and snoring.   Endocrine: Negative for heat intolerance and polyphagia.  Hematologic/Lymphatic: Negative for bleeding problem. Does not bruise/bleed easily.  Skin: Negative for flushing, nail changes, rash and suspicious lesions.  Musculoskeletal: Negative for arthritis, joint pain, muscle cramps, myalgias, neck pain and stiffness.  Gastrointestinal:  Negative for abdominal pain, bowel incontinence, diarrhea and excessive appetite.  Genitourinary: Negative for decreased libido, genital sores and incomplete emptying.  Neurological: Negative for brief paralysis, focal weakness, headaches and loss of balance.  Psychiatric/Behavioral: Negative for altered mental status, depression and suicidal ideas.  Allergic/Immunologic: Negative for HIV exposure and persistent infections.    EKGs/Labs/Other Studies Reviewed:    The following studies were reviewed today:   EKG:  The ekg ordered today demonstrates   Recent Labs: No results found for requested labs within last 365 days.  Recent Lipid Panel No results found for: CHOL, TRIG, HDL, CHOLHDL, VLDL, LDLCALC, LDLDIRECT  Physical Exam:    VS:  Ht 5' 10 (1.778 m)   Wt 183 lb (83 kg)   BMI 26.26 kg/m     Wt Readings from Last 3 Encounters:  11/05/23 183 lb (83 kg)  07/21/23 182 lb 12.8 oz (82.9 kg)  04/13/22 220 lb 14.4 oz (100.2 kg)     GEN: Well nourished, well developed in no acute distress HEENT: Normal NECK: No JVD; No carotid bruits LYMPHATICS: No lymphadenopathy CARDIAC: S1S2 noted,RRR, no murmurs, rubs, gallops RESPIRATORY:  Clear to auscultation without rales, wheezing or rhonchi  ABDOMEN: Soft, non-tender, non-distended, +bowel sounds, no guarding. EXTREMITIES: No edema, No cyanosis, no clubbing MUSCULOSKELETAL:  No deformity  SKIN: Warm and dry NEUROLOGIC:  Alert and oriented x 3, non-focal PSYCHIATRIC:  Normal affect, good insight  ASSESSMENT:    1. PSVT (paroxysmal supraventricular tachycardia) (HCC)     PLAN:    PSVT - she is tolerating the propanolol 10 mg daily. No need to change her medications at this time.    Total time - 12 mins   The patient is in agreement with the above plan. The patient left the office in stable condition.  The patient will follow up in   Medication Adjustments/Labs and Tests Ordered: Current medicines are  reviewed at length with the patient today.  Concerns regarding medicines are outlined above.  No orders of the defined types were placed in this encounter.  Meds ordered this encounter  Medications   propranolol  (INDERAL ) 10 MG tablet    Sig: Take 1 tablet (10 mg total) by mouth daily.    Dispense:  90 tablet    Refill:  3    There are no Patient Instructions on file for this visit.   Adopting a Healthy Lifestyle.  Know what a healthy weight is for you (roughly BMI <25) and aim to maintain this   Aim for 7+ servings of fruits and vegetables daily   65-80+ fluid ounces of water  or unsweet tea for healthy kidneys   Limit to max 1 drink of alcohol per day; avoid smoking/tobacco   Limit animal fats in diet for cholesterol and heart health - choose grass fed whenever available   Avoid highly processed foods, and foods high  in saturated/trans fats   Aim for low stress - take time to unwind and care for your mental health   Aim for 150 min of moderate intensity exercise weekly for heart health, and weights twice weekly for bone health   Aim for 7-9 hours of sleep daily   When it comes to diets, agreement about the perfect plan isnt easy to find, even among the experts. Experts at the [facility] of [employer] developed an idea known as the Healthy Eating Plate. Just imagine a plate divided into logical, healthy portions.   The emphasis is on diet quality:   Load up on vegetables and fruits - one-half of your plate: Aim for color and variety, and remember that potatoes dont count.   Go for whole grains - one-quarter of your plate: Whole wheat, barley, wheat berries, quinoa, oats, Kaeser rice, and foods made with them. If you want pasta, go with whole wheat pasta.   Protein power - one-quarter of your plate: Fish, chicken, beans, and nuts are all healthy, versatile protein sources. Limit red meat.   The diet, however, does go beyond the plate, offering a few other  suggestions.   Use healthy plant oils, such as olive, canola, soy, corn, sunflower and peanut. Check the labels, and avoid partially hydrogenated oil, which have unhealthy trans fats.   If youre thirsty, drink water . Coffee and tea are good in moderation, but skip sugary drinks and limit milk and dairy products to one or two daily servings.   The type of carbohydrate in the diet is more important than the amount. Some sources of carbohydrates, such as vegetables, fruits, whole grains, and beans-are healthier than others.   Finally, stay active  Signed, Issis Lindseth, DO  11/05/2023 8:22 AM    [facility]
